# Patient Record
Sex: Male | Born: 2000 | Race: Black or African American | Hispanic: No | Marital: Single | Smoking: Never smoker
Health system: Southern US, Community
[De-identification: ages and names within clinical notes are randomized; demographics above are authoritative.]

## PROBLEM LIST (undated history)

## (undated) DIAGNOSIS — F32A Depression, unspecified: Secondary | ICD-10-CM

## (undated) DIAGNOSIS — F419 Anxiety disorder, unspecified: Secondary | ICD-10-CM

## (undated) DIAGNOSIS — T7840XA Allergy, unspecified, initial encounter: Secondary | ICD-10-CM

## (undated) HISTORY — DX: Anxiety disorder, unspecified: F41.9

## (undated) HISTORY — DX: Allergy, unspecified, initial encounter: T78.40XA

## (undated) HISTORY — DX: Depression, unspecified: F32.A

---
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
## 2020-05-18 DIAGNOSIS — F900 Attention-deficit hyperactivity disorder, predominantly inattentive type: Secondary | ICD-10-CM | POA: Diagnosis not present

## 2020-05-18 DIAGNOSIS — F70 Mild intellectual disabilities: Secondary | ICD-10-CM | POA: Diagnosis not present

## 2020-05-18 DIAGNOSIS — F411 Generalized anxiety disorder: Secondary | ICD-10-CM | POA: Diagnosis not present

## 2020-05-18 DIAGNOSIS — F429 Obsessive-compulsive disorder, unspecified: Secondary | ICD-10-CM | POA: Diagnosis not present

## 2020-12-28 DIAGNOSIS — F900 Attention-deficit hyperactivity disorder, predominantly inattentive type: Secondary | ICD-10-CM | POA: Diagnosis not present

## 2020-12-28 DIAGNOSIS — F411 Generalized anxiety disorder: Secondary | ICD-10-CM | POA: Diagnosis not present

## 2020-12-28 DIAGNOSIS — F70 Mild intellectual disabilities: Secondary | ICD-10-CM | POA: Diagnosis not present

## 2020-12-28 DIAGNOSIS — F429 Obsessive-compulsive disorder, unspecified: Secondary | ICD-10-CM | POA: Diagnosis not present

## 2020-12-30 DIAGNOSIS — F1520 Other stimulant dependence, uncomplicated: Secondary | ICD-10-CM | POA: Diagnosis not present

## 2020-12-30 DIAGNOSIS — Z79899 Other long term (current) drug therapy: Secondary | ICD-10-CM | POA: Diagnosis not present

## 2020-12-30 DIAGNOSIS — Z5181 Encounter for therapeutic drug level monitoring: Secondary | ICD-10-CM | POA: Diagnosis not present

## 2021-01-24 ENCOUNTER — Other Ambulatory Visit: Payer: Self-pay

## 2021-01-24 DIAGNOSIS — R590 Localized enlarged lymph nodes: Secondary | ICD-10-CM | POA: Diagnosis not present

## 2021-01-24 DIAGNOSIS — R519 Headache, unspecified: Secondary | ICD-10-CM | POA: Insufficient documentation

## 2021-01-24 DIAGNOSIS — Z20822 Contact with and (suspected) exposure to covid-19: Secondary | ICD-10-CM | POA: Diagnosis not present

## 2021-01-24 DIAGNOSIS — R509 Fever, unspecified: Secondary | ICD-10-CM | POA: Insufficient documentation

## 2021-01-24 DIAGNOSIS — M542 Cervicalgia: Secondary | ICD-10-CM | POA: Insufficient documentation

## 2021-01-24 DIAGNOSIS — Z03818 Encounter for observation for suspected exposure to other biological agents ruled out: Secondary | ICD-10-CM | POA: Diagnosis not present

## 2021-01-24 DIAGNOSIS — R221 Localized swelling, mass and lump, neck: Secondary | ICD-10-CM | POA: Diagnosis not present

## 2021-01-24 LAB — CBC
HCT: 42.4 % (ref 39.0–52.0)
Hemoglobin: 13.9 g/dL (ref 13.0–17.0)
MCH: 27.3 pg (ref 26.0–34.0)
MCHC: 32.8 g/dL (ref 30.0–36.0)
MCV: 83.3 fL (ref 80.0–100.0)
Platelets: 209 10*3/uL (ref 150–400)
RBC: 5.09 MIL/uL (ref 4.22–5.81)
RDW: 13.2 % (ref 11.5–15.5)
WBC: 8.3 10*3/uL (ref 4.0–10.5)
nRBC: 0 % (ref 0.0–0.2)

## 2021-01-24 LAB — COMPREHENSIVE METABOLIC PANEL
ALT: 21 U/L (ref 0–44)
AST: 21 U/L (ref 15–41)
Albumin: 4.2 g/dL (ref 3.5–5.0)
Alkaline Phosphatase: 64 U/L (ref 38–126)
Anion gap: 9 (ref 5–15)
BUN: 15 mg/dL (ref 6–20)
CO2: 27 mmol/L (ref 22–32)
Calcium: 9.5 mg/dL (ref 8.9–10.3)
Chloride: 102 mmol/L (ref 98–111)
Creatinine, Ser: 1.06 mg/dL (ref 0.61–1.24)
GFR, Estimated: >60 mL/min (ref >60)
Glucose, Bld: 97 mg/dL (ref 70–99)
Potassium: 3.7 mmol/L (ref 3.5–5.1)
Sodium: 138 mmol/L (ref 135–145)
Total Bilirubin: 1 mg/dL (ref 0.3–1.2)
Total Protein: 8.1 g/dL (ref 6.5–8.1)

## 2021-01-24 LAB — RESP PANEL BY RT-PCR (FLU A&B, COVID) ARPGX2
Influenza A by PCR: NEGATIVE
Influenza B by PCR: NEGATIVE
SARS Coronavirus 2 by RT PCR: NEGATIVE

## 2021-01-24 MED ORDER — ACETAMINOPHEN 325 MG PO TABS
650.0000 mg | ORAL_TABLET | Freq: Once | ORAL | Status: AC | PRN
  Administered 2021-01-24: 650 mg via ORAL
  Filled 2021-01-24: qty 2

## 2021-01-24 NOTE — ED Triage Notes (Signed)
Pt reports his s/s started Tuesday, states last time he had antipyretic was at 0600. Pt reports having a headache, chills, and a clicking in his ears

## 2021-01-25 ENCOUNTER — Emergency Department
Admission: EM | Admit: 2021-01-25 | Discharge: 2021-01-25 | Disposition: A | Discharge status: Home / Self Care | Payer: BC Managed Care – PPO | Attending: Emergency Medicine | Admitting: Emergency Medicine

## 2021-01-25 ENCOUNTER — Emergency Department: Payer: BC Managed Care – PPO

## 2021-01-25 DIAGNOSIS — R509 Fever, unspecified: Secondary | ICD-10-CM

## 2021-01-25 DIAGNOSIS — M542 Cervicalgia: Secondary | ICD-10-CM

## 2021-01-25 DIAGNOSIS — R221 Localized swelling, mass and lump, neck: Secondary | ICD-10-CM | POA: Diagnosis not present

## 2021-01-25 DIAGNOSIS — R519 Headache, unspecified: Secondary | ICD-10-CM | POA: Diagnosis not present

## 2021-01-25 LAB — PROTEIN AND GLUCOSE, CSF
Glucose, CSF: 65 mg/dL (ref 40–70)
Total  Protein, CSF: 18 mg/dL (ref 15–45)

## 2021-01-25 LAB — CSF CELL COUNT WITH DIFFERENTIAL
Eosinophils, CSF: 0 %
Eosinophils, CSF: 0 %
Lymphs, CSF: 25 %
Lymphs, CSF: 70 %
Monocyte-Macrophage-Spinal Fluid: 30 %
Monocyte-Macrophage-Spinal Fluid: 75 %
RBC Count, CSF: 0 /mm3 (ref 0–3)
RBC Count, CSF: 3 /mm3 (ref 0–3)
Segmented Neutrophils-CSF: 0 %
Segmented Neutrophils-CSF: 0 %
Tube #: 1
Tube #: 3
WBC, CSF: 3 /mm3 (ref 0–5)
WBC, CSF: 5 /mm3 (ref 0–5)

## 2021-01-25 LAB — GROUP A STREP BY PCR: Group A Strep by PCR: NOT DETECTED

## 2021-01-25 LAB — URINALYSIS, COMPLETE (UACMP) WITH MICROSCOPIC
Bilirubin Urine: NEGATIVE
Glucose, UA: NEGATIVE mg/dL
Hgb urine dipstick: NEGATIVE
Leukocytes,Ua: NEGATIVE
Nitrite: NEGATIVE
Protein, ur: 30 mg/dL — AB
Specific Gravity, Urine: 1.025 (ref 1.005–1.030)
Squamous Epithelial / HPF: NONE SEEN (ref 0–5)
pH: 6 (ref 5.0–8.0)

## 2021-01-25 LAB — PROTIME-INR
INR: 1.2 (ref 0.8–1.2)
Prothrombin Time: 14.8 seconds (ref 11.4–15.2)

## 2021-01-25 LAB — MONONUCLEOSIS SCREEN: Mono Screen: NEGATIVE

## 2021-01-25 LAB — LACTIC ACID, PLASMA: Lactic Acid, Venous: 0.8 mmol/L (ref 0.5–1.9)

## 2021-01-25 LAB — HIV ANTIBODY (ROUTINE TESTING W REFLEX): HIV Screen 4th Generation wRfx: NONREACTIVE

## 2021-01-25 LAB — APTT: aPTT: 33 seconds (ref 24–36)

## 2021-01-25 LAB — PROCALCITONIN: Procalcitonin: 0.11 ng/mL

## 2021-01-25 MED ORDER — IOHEXOL 300 MG/ML  SOLN
75.0000 mL | Freq: Once | INTRAMUSCULAR | Status: AC | PRN
  Administered 2021-01-25: 75 mL via INTRAVENOUS
  Filled 2021-01-25: qty 75

## 2021-01-25 MED ORDER — KETOROLAC TROMETHAMINE 30 MG/ML IJ SOLN
15.0000 mg | Freq: Once | INTRAMUSCULAR | Status: AC
  Administered 2021-01-25: 15 mg via INTRAVENOUS
  Filled 2021-01-25: qty 1

## 2021-01-25 MED ORDER — LACTATED RINGERS IV BOLUS
1000.0000 mL | Freq: Once | INTRAVENOUS | Status: AC
  Administered 2021-01-25: 1000 mL via INTRAVENOUS

## 2021-01-25 NOTE — ED Provider Notes (Signed)
[facility] Provider Note    Event Date/Time   First MD Initiated Contact with Patient 01/25/21 [id number]     (approximate)   History   Fever and Headache   HPI  Ricky Ramsey is a 21 y.o. male without significant past medical history who presents accompanied by his father for assessment of 4 days of fever and headache.  Patient states he has also had some soreness on the left side of his neck and he has some achiness in his lower back as well.  He endorses some photophobia.  He has tried Tylenol ibuprofen but this has not helped that much.  He also states that her left ear has been popping and have some pressure.  No right ear pain.  He denies any nausea, vomiting, diarrhea, burning with urination, cough, chest pain, shortness of breath, rash or extremity pain.  No recent falls or injuries.  He denies EtOH use illicit drug use or tobacco abuse.  No other acute concerns at this time.  Patient denies any IV drug use or history of diabetes or any recent trauma.      Physical Exam  Triage Vital Signs: ED Triage Vitals [01/24/21 1904]  Enc Vitals Group     BP 109/69     Pulse Rate (!) 118     Resp 18     Temp (!) 102.9 F (39.4 C)     Temp Source Oral     SpO2 99 %     Weight 187 lb (84.8 kg)     Height '5\' 11"'  (1.803 m)     Head Circumference      Peak Flow      Pain Score 4     Pain Loc      Pain Edu?      Excl. in [place]?     Most recent vital signs: Vitals:   01/25/21 0236 01/25/21 0610  BP: 116/64 112/61  Pulse: 89 81  Resp: 18 16  Temp: (!) 101 F (38.3 C)   SpO2: 97% 96%    General: Awake, appears uncomfortable. CV:  Good peripheral perfusion.  2+ radial pulses.  No murmurs rubs or gallops. Resp:  Normal effort.  Clear bilaterally. Abd:  No distention.  Soft throughout. Other:  Some mild posterior oropharyngeal erythema.  There is also significant left-sided cervical lymphadenopathy as well as some edema and tenderness over the left neck at  the inferior portion.  TMs are unremarkable bilaterally.  Patient has slightly decreased range of motion with pain to left and right but is able to flex and extend all but very slowly.  He does seem photophobic.  Cranial nerves II through XII are grossly intact.  He has symmetric strength in all extremities.  His lower back is unremarkable.   ED Results / Procedures / Treatments  Labs (all labs ordered are listed, but only abnormal results are displayed) Labs Reviewed  URINALYSIS, COMPLETE (UACMP) WITH MICROSCOPIC - Abnormal; Notable for the following components:      Result Value   Ketones, ur TRACE (*)    Protein, ur 30 (*)    Bacteria, UA RARE (*)    All other components within normal limits  RESP PANEL BY RT-PCR (FLU A&B, COVID) [id number]  GROUP A STREP BY PCR  CSF CULTURE W GRAM STAIN  CULTURE, BLOOD (ROUTINE X 2)  CULTURE, BLOOD (ROUTINE X 2)  CBC  COMPREHENSIVE METABOLIC PANEL  LACTIC ACID, PLASMA  PROTIME-INR  APTT  PROCALCITONIN  MONONUCLEOSIS SCREEN  CSF CELL COUNT WITH DIFFERENTIAL  PROTEIN AND GLUCOSE, CSF  CSF CELL COUNT WITH DIFFERENTIAL  HIV ANTIBODY (ROUTINE TESTING W REFLEX)  DIFFERENTIAL     EKG     RADIOLOGY  Chest x-ray ordered and reviewed by myself shows no focal consolidation, effusion, edema, pneumothorax or other clear acute intrathoracic process.  I have reviewed radiology's interpretation and agree with their findings.  CT head ordered and reviewed by myself shows no evidence of mass-effect, edema, hemorrhage, ventriculomegaly or other acute intracranial process.  As reviewed radiologist interpretation and agree with their findings.  CT neck shows bilateral lymphadenopathy without any evidence of deep space infection abscess or fluid collection.  Reviewed radiologist rotation and agree with their findings.   PROCEDURES:  Critical Care performed: No  .Lumbar Puncture  Date/Time: 01/25/2021 4:26 AM Performed by: Lucrezia Starch,  MD Authorized by: Lucrezia Starch, MD   Consent:    Consent obtained:  Written and verbal   Consent given by:  Patient   Risks, benefits, and alternatives were discussed: yes     Risks discussed:  Bleeding, headache, infection, repeat procedure, pain and nerve damage   Alternatives discussed:  No treatment and observation Universal protocol:    Patient identity confirmed:  Verbally with patient and arm band Pre-procedure details:    Procedure purpose:  Diagnostic   Preparation: Patient was prepped and draped in usual sterile fashion   Anesthesia:    Anesthesia method:  Local infiltration   Local anesthetic:  Lidocaine 1% w/o epi Procedure details:    Lumbar space:  L3-L4 interspace   Patient position:  Sitting   Needle gauge:  18   Ultrasound guidance: no     Number of attempts:  2   Fluid appearance:  Clear   Tubes of fluid:  4 Post-procedure details:    Puncture site:  Adhesive bandage applied   Procedure completion:  Tolerated well, no immediate complications    MEDICATIONS ORDERED IN ED: Medications  acetaminophen (TYLENOL) tablet 650 mg (650 mg Oral Given 01/24/21 1909)  lactated ringers bolus 1,000 mL (0 mLs Intravenous Stopped 01/25/21 0353)  ketorolac (TORADOL) 30 MG/ML injection 15 mg (15 mg Intravenous Given 01/25/21 0157)  iohexol (OMNIPAQUE) 300 MG/ML solution 75 mL (75 mLs Intravenous Contrast Given 01/25/21 0214)     IMPRESSION / MDM / [place] / ED COURSE  I reviewed the triage vital signs and the nursing notes.                              Differential diagnosis includes, but is not limited to, viral URI, neck abscess, meningitis versus other deep space infection head or neck.  Patient denies any respiratory symptoms or cough and has no findings on exam or chest x-ray to suggest a bacterial pneumonia.  No clear risk factors or specific midline neck pain to suggest epidural abscess.  Chest x-ray ordered and reviewed by myself shows no focal  consolidation, effusion, edema, pneumothorax or other clear acute intrathoracic process.  I have reviewed radiology's interpretation and agree with their findings.  CT head ordered and reviewed by myself shows no evidence of mass-effect, edema, hemorrhage, ventriculomegaly or other acute intracranial process.  As reviewed radiologist interpretation and agree with their findings.  CT neck shows bilateral lymphadenopathy without any evidence of deep space infection abscess or fluid collection.  Reviewed radiologist rotation and agree with their findings.  CMP shows  no significant electrolyte or metabolic derangements.  CBC without leukocytosis or acute anemia.  COVID and influenza PCR is negative.  Lactic acid is nonelevated.  Coagulation studies are unremarkable.  UA is not suggestive of urinary source of infection.  Pro-Cal is 0.11.  Rapid strep screen and monoscreen is negative.  As patient initially met SIRS criteria with fever and tachycardia, screening sepsis labs ordered including blood cultures and lactic acid.  Overall patient does not appear septic on exam and lactic acid is not elevated.  To assess for possible viral meningitis LP performed per procedure note above and CSF sent.  CSF shows 3 WBCs which is not consistent with meningitis.  CSF protein and glucose are within normalized.  Given reassuring CSF suspicion for meningitis and I suspect likely a viral process.  Discussed using Tylenol ibuprofen as needed for fever and aches and recommendation for close outpatient follow-up.  Also discussed return immediately for any new or worsening of symptoms.  Discharged in stable condition.      FINAL CLINICAL IMPRESSION(S) / ED DIAGNOSES   Final diagnoses:  Fever, unspecified fever cause  Nonintractable headache, unspecified chronicity pattern, unspecified headache type  Neck pain     Rx / DC Orders   ED Discharge Orders     None        Note:  This document was prepared using  Dragon voice recognition software and may include unintentional dictation errors.   Lucrezia Starch, MD 01/25/21 [phone]

## 2021-01-28 DIAGNOSIS — R509 Fever, unspecified: Secondary | ICD-10-CM | POA: Diagnosis not present

## 2021-01-28 DIAGNOSIS — R519 Headache, unspecified: Secondary | ICD-10-CM | POA: Diagnosis not present

## 2021-01-28 DIAGNOSIS — H53149 Visual discomfort, unspecified: Secondary | ICD-10-CM | POA: Diagnosis not present

## 2021-01-28 DIAGNOSIS — F419 Anxiety disorder, unspecified: Secondary | ICD-10-CM | POA: Diagnosis not present

## 2021-01-28 LAB — CSF CULTURE W GRAM STAIN
Culture: NO GROWTH
Gram Stain: NONE SEEN

## 2021-01-30 LAB — CULTURE, BLOOD (ROUTINE X 2)
Culture: NO GROWTH
Culture: NO GROWTH
Special Requests: ADEQUATE
Special Requests: ADEQUATE

## 2021-03-25 ENCOUNTER — Ambulatory Visit: Payer: BC Managed Care – PPO | Admitting: Nurse Practitioner

## 2021-03-25 ENCOUNTER — Other Ambulatory Visit: Payer: Self-pay

## 2021-03-25 ENCOUNTER — Encounter: Payer: Self-pay | Admitting: Nurse Practitioner

## 2021-03-25 VITALS — BP 110/72 | HR 100 | Temp 98.7°F | Resp 18 | Ht 69.5 in | Wt 179.5 lb

## 2021-03-25 DIAGNOSIS — F419 Anxiety disorder, unspecified: Secondary | ICD-10-CM | POA: Diagnosis not present

## 2021-03-25 DIAGNOSIS — Z0000 Encounter for general adult medical examination without abnormal findings: Secondary | ICD-10-CM | POA: Diagnosis not present

## 2021-03-25 DIAGNOSIS — Z7689 Persons encountering health services in other specified circumstances: Secondary | ICD-10-CM | POA: Diagnosis not present

## 2021-03-25 DIAGNOSIS — F330 Major depressive disorder, recurrent, mild: Secondary | ICD-10-CM | POA: Insufficient documentation

## 2021-03-25 NOTE — Progress Notes (Signed)
BP 110/72   Pulse 100   Temp 98.7 F (37.1 C) (Oral)   Resp 18   Ht 5' 9.5" (1.765 m)   Wt 179 lb 8 oz (81.4 kg)   SpO2 96%   BMI 26.13 kg/m    Subjective:    Patient ID: Ricky Ramsey, male    DOB: 07/05/2000, 21 y.o.   MRN: [id number]  HPI: Ricky Ramsey is a 21 y.o. male, here alone  Chief Complaint  Patient presents with   Establish Care   Patient presents for annual CPE and to establish care  Establish Care: He says his last physical was two years ago.   Depression/anxiety: He says he is currently taking Zoloft from Neurology.  He does have tics when his anxiety increases.   Depression screen [facility] 2/9 03/25/2021  Decreased Interest 0  Down, Depressed, Hopeless 0  PHQ - 2 Score 0  Altered sleeping 0  Tired, decreased energy 0  Change in appetite 0  Feeling bad or failure about yourself  0  Trouble concentrating 0  Moving slowly or fidgety/restless 0  Suicidal thoughts 0  PHQ-9 Score 0  Difficult doing work/chores Not difficult at all   GAD 7 : Generalized Anxiety Score 03/25/2021  Nervous, Anxious, on Edge 1  Control/stop worrying 0  Worry too much - different things 0  Trouble relaxing 0  Restless 0  Easily annoyed or irritable 0  Afraid - awful might happen 0  Total GAD 7 Score 1  Anxiety Difficulty Not difficult at all   IPSS Questionnaire (AUA-7): Over the past month   1)  How often have you had a sensation of not emptying your bladder completely after you finish urinating?  0 - Not at all  2)  How often have you had to urinate again less than two hours after you finished urinating? 0 - Not at all  3)  How often have you found you stopped and started again several times when you urinated?  0 - Not at all  4) How difficult have you found it to postpone urination?  0 - Not at all  5) How often have you had a weak urinary stream?  0 - Not at all  6) How often have you had to push or strain to begin urination?  0 - Not at all  7) How many times did  you most typically get up to urinate from the time you went to bed until the time you got up in the morning?  0 - None  Total score:  0-7 mildly symptomatic   8-19 moderately symptomatic   20-35 severely symptomatic     Diet: spicy working on getting to a more well balanced diet Exercise: walking across campus, discussed increasing physical activity to the recommended 150 min a week Sleep: 7 hours  Hypertension:  BP Readings from Last 3 Encounters:  03/25/21 110/72  01/25/21 112/61  05/28/17 (!) 139/99    Obesity: Wt Readings from Last 3 Encounters:  03/25/21 179 lb 8 oz (81.4 kg)  01/24/21 187 lb (84.8 kg)  05/26/17 146 lb 9.7 oz (66.5 kg) (55 %, Z= 0.12)*   * Growth percentiles are based on CDC (Boys, 2-20 Years) data.   BMI Readings from Last 3 Encounters:  03/25/21 26.13 kg/m  01/24/21 26.08 kg/m     Lipids:  Lab Results  Component Value Date   CHOL 189 (H) 07/13/2018   Lab Results  Component Value Date   HDL 44  07/13/2018   Lab Results  Component Value Date   LDLCALC 131 (H) 07/13/2018   Lab Results  Component Value Date   TRIG 68 07/13/2018   Lab Results  Component Value Date   CHOLHDL 4.3 07/13/2018   No results found for: LDLDIRECT Glucose:  Glucose, Bld  Date Value Ref Range Status  01/24/2021 97 70 - 99 mg/dL Final    Comment:    Glucose reference range applies only to samples taken after fasting for at least 8 hours.  05/26/2017 102 (H) 65 - 99 mg/dL Final    Flowsheet Row Office Visit from 03/25/2021 in [facility]  AUDIT-C Score 0       Single STD testing and prevention (HIV/chl/gon/syphilis): 01/25/2021 Hep C: postponed until next time we do labs  Skin cancer: Discussed monitoring for atypical lesions Colorectal cancer: no conerns Prostate cancer: no concerns No results found for: PSA   Lung cancer:   Low Dose CT Chest recommended if Age 42-80 years, 30 pack-year currently smoking OR have quit w/in 15years.  Patient does not qualify.   AAA:  The USPSTF recommends one-time screening with ultrasonography in men ages 39 to 29 years who have ever smoked ECG:  01/25/2021  Vaccines:  HPV: up to at age 48 , ask insurance if age between 44-45  Shingrix: 72-64 yo and ask insurance if covered when patient above 56 yo Pneumonia:  educated and discussed with patient. Flu:  educated and discussed with patient.  Advanced Care Planning: A voluntary discussion about advance care planning including the explanation and discussion of advance directives.  Discussed health care proxy and Living will.  Patient Active Problem List   Diagnosis Date Noted   Mood disorder (HCC) 03/22/2017   Motor tic disorder 02/17/2017    No past surgical history on file.  No family history on file.  Social History   Socioeconomic History   Marital status: Single    Spouse name: Not on file   Number of children: Not on file   Years of education: Not on file   Highest education level: Not on file  Occupational History   Not on file  Tobacco Use   Smoking status: Never   Smokeless tobacco: Never  Vaping Use   Vaping Use: Never used  Substance and Sexual Activity   Alcohol use: Never   Drug use: Never   Sexual activity: Yes    Birth control/protection: Condom  Other Topics Concern   Not on file  Social History Narrative   [employer] in Psychology   Social Determinants of Health   Financial Resource Strain: Low Risk    Difficulty of Paying Living Expenses: Not hard at all  Food Insecurity: No Food Insecurity   Worried About Programme researcher, broadcasting/film/video in the Last Year: Never true   Barista in the Last Year: Never true  Transportation Needs: No Transportation Needs   Lack of Transportation (Medical): No   Lack of Transportation (Non-Medical): No  Physical Activity: Insufficiently Active   Days of Exercise per Week: 3 days   Minutes of Exercise per Session: 30 min  Stress: No Stress  Concern Present   Feeling of Stress : Not at all  Social Connections: Moderately Integrated   Frequency of Communication with Friends and Family: More than three times a week   Frequency of Social Gatherings with Friends and Family: More than three times a week   Attends Religious Services: More  than 4 times per year   Active Member of Clubs or Organizations: Yes   Attends Engineer, structural: More than 4 times per year   Marital Status: Never married  Intimate Partner Violence: Not At Risk   Fear of Current or Ex-Partner: No   Emotionally Abused: No   Physically Abused: No   Sexually Abused: No     Current Outpatient Medications:    sertraline (ZOLOFT) 100 MG tablet, Take 100 mg by mouth daily., Disp: , Rfl:   No Known Allergies   ROS  Constitutional: Negative for fever or weight change.  Respiratory: Negative for cough and shortness of breath.   Cardiovascular: Negative for chest pain or palpitations.  Gastrointestinal: Negative for abdominal pain, no bowel changes.  Musculoskeletal: Negative for gait problem or joint swelling.  Skin: Negative for rash.  Neurological: Negative for dizziness or headache.  No other specific complaints in a complete review of systems (except as listed in HPI above).    Objective  Vitals:   03/25/21 1334  BP: 110/72  Pulse: 100  Resp: 18  Temp: 98.7 F (37.1 C)  TempSrc: Oral  SpO2: 96%  Weight: 179 lb 8 oz (81.4 kg)  Height: 5' 9.5" (1.765 m)    Body mass index is 26.13 kg/m.  Physical Exam  Constitutional: Patient appears well-developed and well-nourished. No distress.  HENT: Head: Normocephalic and atraumatic. Ears: B TMs ok, no erythema or effusion; Nose: Nose normal. Mouth/Throat: Oropharynx is clear and moist. No oropharyngeal exudate.  Eyes: Conjunctivae and EOM are normal. Pupils are equal, round, and reactive to light. No scleral icterus.  Neck: Normal range of motion. Neck supple. No JVD present. No  thyromegaly present.  Cardiovascular: Normal rate, regular rhythm and normal heart sounds.  No murmur heard. No BLE edema. Pulmonary/Chest: Effort normal and breath sounds normal. No respiratory distress. Abdominal: Soft. Bowel sounds are normal, no distension. There is no tenderness. no masses Musculoskeletal: Normal range of motion, no joint effusions. No gross deformities Neurological: he is alert and oriented to person, place, and time. No cranial nerve deficit. Coordination, balance, strength, speech and gait are normal.  Skin: Skin is warm and dry. No rash noted. No erythema.  Psychiatric: Patient has a normal mood and affect. behavior is normal. Judgment and thought content normal.   Recent Results (from the past 2160 hour(s))  CBC     Status: None   Collection Time: 01/24/21  7:08 PM  Result Value Ref Range   WBC 8.3 4.0 - 10.5 K/uL   RBC 5.09 4.22 - 5.81 MIL/uL   Hemoglobin 13.9 13.0 - 17.0 g/dL   HCT 16.1 09.6 - 04.5 %   MCV 83.3 80.0 - 100.0 fL   MCH 27.3 26.0 - 34.0 pg   MCHC 32.8 30.0 - 36.0 g/dL   RDW 40.9 81.1 - 91.4 %   Platelets 209 150 - 400 K/uL   nRBC 0.0 0.0 - 0.2 %    Comment: Performed at [facility], [address] [place], [place] [address]  Comprehensive metabolic panel     Status: None   Collection Time: 01/24/21  7:08 PM  Result Value Ref Range   Sodium 138 135 - 145 mmol/L   Potassium 3.7 3.5 - 5.1 mmol/L   Chloride 102 98 - 111 mmol/L   CO2 27 22 - 32 mmol/L   Glucose, Bld 97 70 - 99 mg/dL    Comment: Glucose reference range applies only to samples taken after  fasting for at least 8 hours.   BUN 15 6 - 20 mg/dL   Creatinine, Ser 1.61 0.61 - 1.24 mg/dL   Calcium 9.5 8.9 - 09.6 mg/dL   Total Protein 8.1 6.5 - 8.1 g/dL   Albumin 4.2 3.5 - 5.0 g/dL   AST 21 15 - 41 U/L   ALT 21 0 - 44 U/L   Alkaline Phosphatase 64 38 - 126 U/L   Total Bilirubin 1.0 0.3 - 1.2 mg/dL   GFR, Estimated >04 >54 mL/min    Comment: (NOTE) Calculated using the  CKD-EPI Creatinine Equation (2021)    Anion gap 9 5 - 15    Comment: Performed at [facility], [address] [place], [place] [address]  Resp Panel by RT-PCR (Flu A&B, Covid) Nasopharyngeal Swab     Status: None   Collection Time: 01/24/21  7:08 PM   Specimen: Nasopharyngeal Swab; Nasopharyngeal(NP) swabs in vial transport medium  Result Value Ref Range   SARS Coronavirus 2 by RT PCR NEGATIVE NEGATIVE    Comment: (NOTE) SARS-CoV-2 target nucleic acids are NOT DETECTED.  The SARS-CoV-2 RNA is generally detectable in upper respiratory specimens during the acute phase of infection. The lowest concentration of SARS-CoV-2 viral copies this assay can detect is 138 copies/mL. A negative result does not preclude SARS-Cov-2 infection and should not be used as the sole basis for treatment or other patient management decisions. A negative result may occur with  improper specimen collection/handling, submission of specimen other than nasopharyngeal swab, presence of viral mutation(s) within the areas targeted by this assay, and inadequate number of viral copies(<138 copies/mL). A negative result must be combined with clinical observations, patient history, and epidemiological information. The expected result is Negative.  Fact Sheet for Patients:  BloggerCourse.com  Fact Sheet for Healthcare Providers:  SeriousBroker.it  This test is no t yet approved or cleared by the [place] FDA and  has been authorized for detection and/or diagnosis of SARS-CoV-2 by FDA under an Emergency Use Authorization (EUA). This EUA will remain  in effect (meaning this test can be used) for the duration of the COVID-19 declaration under Section 564(b)(1) of the Act, 21 U.S.C.section 360bbb-3(b)(1), unless the authorization is terminated  or revoked sooner.       Influenza A by PCR NEGATIVE NEGATIVE   Influenza B by PCR NEGATIVE NEGATIVE     Comment: (NOTE) The Xpert Xpress SARS-CoV-2/FLU/RSV plus assay is intended as an aid in the diagnosis of influenza from Nasopharyngeal swab specimens and should not be used as a sole basis for treatment. Nasal washings and aspirates are unacceptable for Xpert Xpress SARS-CoV-2/FLU/RSV testing.  Fact Sheet for Patients: BloggerCourse.com  Fact Sheet for Healthcare Providers: SeriousBroker.it  This test is not yet approved or cleared by the [place] FDA and has been authorized for detection and/or diagnosis of SARS-CoV-2 by FDA under an Emergency Use Authorization (EUA). This EUA will remain in effect (meaning this test can be used) for the duration of the COVID-19 declaration under Section 564(b)(1) of the Act, 21 U.S.C. section 360bbb-3(b)(1), unless the authorization is terminated or revoked.  Performed at [facility], [address] [place], [place] [address]   Lactic acid, plasma     Status: None   Collection Time: 01/25/21  1:35 AM  Result Value Ref Range   Lactic Acid, Venous 0.8 0.5 - 1.9 mmol/L    Comment: Performed at [facility], [address], [place], [place] [address]  Protime-INR  Status: None   Collection Time: 01/25/21  1:35 AM  Result Value Ref Range   Prothrombin Time 14.8 11.4 - 15.2 seconds   INR 1.2 0.8 - 1.2    Comment: (NOTE) INR goal varies based on device and disease states. Performed at [facility], [address] [place], [place] [address]   APTT     Status: None   Collection Time: 01/25/21  1:35 AM  Result Value Ref Range   aPTT 33 24 - 36 seconds    Comment: Performed at [facility], [address] [place], [place] [address]  Blood Culture (routine x 2)     Status: None   Collection Time: 01/25/21  1:35 AM   Specimen: BLOOD  Result Value Ref Range   Specimen Description BLOOD RIGHT ANTECUBITAL    Special Requests      BOTTLES DRAWN  AEROBIC AND ANAEROBIC Blood Culture adequate volume   Culture      NO GROWTH 5 DAYS Performed at [facility], [address] [place], [place] [address]    Report Status 01/30/2021 FINAL   Blood Culture (routine x 2)     Status: None   Collection Time: 01/25/21  1:35 AM   Specimen: BLOOD  Result Value Ref Range   Specimen Description BLOOD LEFT ANTECUBITAL    Special Requests      BOTTLES DRAWN AEROBIC AND ANAEROBIC Blood Culture adequate volume   Culture      NO GROWTH 5 DAYS Performed at [facility], [address] [place], [place] [address]    Report Status 01/30/2021 FINAL   Urinalysis, Complete w Microscopic     Status: Abnormal   Collection Time: 01/25/21  1:35 AM  Result Value Ref Range   Color, Urine YELLOW YELLOW   APPearance CLEAR CLEAR   Specific Gravity, Urine 1.025 1.005 - 1.030   pH 6.0 5.0 - 8.0   Glucose, UA NEGATIVE NEGATIVE mg/dL   Hgb urine dipstick NEGATIVE NEGATIVE   Bilirubin Urine NEGATIVE NEGATIVE   Ketones, ur TRACE (A) NEGATIVE mg/dL   Protein, ur 30 (A) NEGATIVE mg/dL   Nitrite NEGATIVE NEGATIVE   Leukocytes,Ua NEGATIVE NEGATIVE   Squamous Epithelial / LPF NONE SEEN 0 - 5   WBC, UA 0-5 0 - 5 WBC/hpf   RBC / HPF 0-5 0 - 5 RBC/hpf   Bacteria, UA RARE (A) NONE SEEN   Mucus PRESENT     Comment: Performed at [facility], [address] [place], [place] [address]  Procalcitonin - Baseline     Status: None   Collection Time: 01/25/21  1:35 AM  Result Value Ref Range   Procalcitonin 0.11 ng/mL    Comment:        Interpretation: PCT (Procalcitonin) <= 0.5 ng/mL: Systemic infection (sepsis) is not likely. Local bacterial infection is possible. (NOTE)       Sepsis PCT Algorithm           Lower Respiratory Tract                                      Infection PCT Algorithm    ----------------------------     ----------------------------         PCT < 0.25 ng/mL                PCT < 0.10 ng/mL  Strongly  encourage             Strongly discourage   discontinuation of antibiotics    initiation of antibiotics    ----------------------------     -----------------------------       PCT 0.25 - 0.50 ng/mL            PCT 0.10 - 0.25 ng/mL               OR       >80% decrease in PCT            Discourage initiation of                                            antibiotics      Encourage discontinuation           of antibiotics    ----------------------------     -----------------------------         PCT >= 0.50 ng/mL              PCT 0.26 - 0.50 ng/mL               AND        <80% decrease in PCT             Encourage initiation of                                             antibiotics       Encourage continuation           of antibiotics    ----------------------------     -----------------------------        PCT >= 0.50 ng/mL                  PCT > 0.50 ng/mL               AND         increase in PCT                  Strongly encourage                                      initiation of antibiotics    Strongly encourage escalation           of antibiotics                                     -----------------------------                                           PCT <= 0.25 ng/mL                                                 OR                                        >  80% decrease in PCT                                      Discontinue / Do not initiate                                             antibiotics  Performed at [facility], [address] [place], [place] [address]   HIV Antibody (routine testing w rflx)     Status: None   Collection Time: 01/25/21  1:35 AM  Result Value Ref Range   HIV Screen 4th Generation wRfx Non Reactive Non Reactive    Comment: Performed at [facility], [address] [place], [place] [address]  Group A Strep by PCR [facility] Only)     Status: None   Collection Time: 01/25/21  3:01 AM   Specimen: Throat; Sterile Swab  Result Value  Ref Range   Group A Strep by PCR NOT DETECTED NOT DETECTED    Comment: Performed at [facility], [address] [place], [place] [address]  Mononucleosis screen     Status: None   Collection Time: 01/25/21  3:01 AM  Result Value Ref Range   Mono Screen NEGATIVE NEGATIVE    Comment: Performed at [facility], [address] [place], [place] [address]  CSF cell count with differential     Status: None   Collection Time: 01/25/21  4:19 AM  Result Value Ref Range   Tube # 1    Color, CSF COLORLESS COLORLESS   Appearance, CSF CLEAR CLEAR   Supernatant CLEAR    RBC Count, CSF 3 0 - 3 /cu mm   WBC, CSF 5 0 - 5 /cu mm   Segmented Neutrophils-CSF 0 %   Lymphs, CSF 70 %   Monocyte-Macrophage-Spinal Fluid 30 %   Eosinophils, CSF 0 %    Comment: Performed at [facility], [address] [place], [place] [address]  Protein and glucose, CSF     Status: None   Collection Time: 01/25/21  4:19 AM  Result Value Ref Range   Glucose, CSF 65 40 - 70 mg/dL   Total  Protein, CSF 18 15 - 45 mg/dL    Comment: Performed at [facility], [address] [place], [place] [address]  CSF culture w Gram Stain     Status: None   Collection Time: 01/25/21  4:19 AM   Specimen: CSF; Cerebrospinal Fluid  Result Value Ref Range   Specimen Description      CSF Performed at [facility], [address] [place], [place] [address]    Special Requests      NONE Performed at [facility], [address] [place], [place] [address]    Gram Stain      NO ORGANISMS SEEN RED BLOOD CELLS PRESENT NO WBC SEEN CYTOSPIN SMEAR    Culture      NO GROWTH 3 DAYS Performed at [facility], [address], [place], [place] [address]    Report Status 01/28/2021 FINAL   CSF cell count with differential     Status: None   Collection Time: 01/25/21  4:19 AM  Result Value Ref Range   Tube # 3    Color, CSF  COLORLESS COLORLESS   Appearance, CSF CLEAR  CLEAR   Supernatant CLEAR    RBC Count, CSF 0 0 - 3 /cu mm   WBC, CSF 3 0 - 5 /cu mm   Segmented Neutrophils-CSF 0 %   Lymphs, CSF 25 %   Monocyte-Macrophage-Spinal Fluid 75 %   Eosinophils, CSF 0 %    Comment: Performed at [facility], [address] [place], [place] [address]     Fall Risk: Fall Risk  03/25/2021  Falls in the past year? 0  Number falls in past yr: 0  Injury with Fall? 0  Follow up Falls evaluation completed     Functional Status Survey: Is the patient deaf or have difficulty hearing?: No Does the patient have difficulty seeing, even when wearing glasses/contacts?: No Does the patient have difficulty concentrating, remembering, or making decisions?: No Does the patient have difficulty walking or climbing stairs?: No Does the patient have difficulty dressing or bathing?: No Does the patient have difficulty doing errands alone such as visiting a doctor's office or shopping?: No    Assessment & Plan  1. Annual physical exam -increase physical activity to at least 150 min a week -work on a more well balanced diet, try and avoid foods high in saturated fat  2. Mild episode of recurrent major depressive disorder (HCC)  -continue current treatment plan with Neurology 3. Anxiety -continue current treatment plan with Neurology  4. Encounter to establish care  -labs recently done by neurology, will get labs at next physical   -Prostate cancer screening and PSA options (with potential risks and benefits of testing vs not testing) were discussed along with recent recs/guidelines. -USPSTF grade A and B recommendations reviewed with patient; age-appropriate recommendations, preventive care, screening tests, etc discussed and encouraged; healthy living encouraged; see AVS for patient education given to patient -Discussed importance of 150 minutes of physical activity weekly, eat two servings of fish weekly, eat one serving of tree nuts ( cashews,  pistachios, pecans, almonds.Marland Kitchen) every other day, eat 6 servings of fruit/vegetables daily and drink plenty of water and avoid sweet beverages.     Follow up plan: Return in about 1 year (around 03/26/2022) for cpe.

## 2021-09-01 DIAGNOSIS — F411 Generalized anxiety disorder: Secondary | ICD-10-CM | POA: Diagnosis not present

## 2021-09-01 DIAGNOSIS — F429 Obsessive-compulsive disorder, unspecified: Secondary | ICD-10-CM | POA: Diagnosis not present

## 2021-09-01 DIAGNOSIS — F70 Mild intellectual disabilities: Secondary | ICD-10-CM | POA: Diagnosis not present

## 2021-09-01 DIAGNOSIS — F900 Attention-deficit hyperactivity disorder, predominantly inattentive type: Secondary | ICD-10-CM | POA: Diagnosis not present

## 2022-03-02 DIAGNOSIS — Z0389 Encounter for observation for other suspected diseases and conditions ruled out: Secondary | ICD-10-CM | POA: Diagnosis not present

## 2022-03-02 DIAGNOSIS — F1520 Other stimulant dependence, uncomplicated: Secondary | ICD-10-CM | POA: Diagnosis not present

## 2022-03-02 DIAGNOSIS — Z79899 Other long term (current) drug therapy: Secondary | ICD-10-CM | POA: Diagnosis not present

## 2022-03-02 DIAGNOSIS — Z5181 Encounter for therapeutic drug level monitoring: Secondary | ICD-10-CM | POA: Diagnosis not present

## 2022-03-03 DIAGNOSIS — F70 Mild intellectual disabilities: Secondary | ICD-10-CM | POA: Diagnosis not present

## 2022-03-03 DIAGNOSIS — F900 Attention-deficit hyperactivity disorder, predominantly inattentive type: Secondary | ICD-10-CM | POA: Diagnosis not present

## 2022-03-03 DIAGNOSIS — F429 Obsessive-compulsive disorder, unspecified: Secondary | ICD-10-CM | POA: Diagnosis not present

## 2022-03-03 DIAGNOSIS — F411 Generalized anxiety disorder: Secondary | ICD-10-CM | POA: Diagnosis not present

## 2022-11-27 ENCOUNTER — Encounter: Payer: Self-pay | Admitting: Physician Assistant

## 2022-11-27 ENCOUNTER — Ambulatory Visit (INDEPENDENT_AMBULATORY_CARE_PROVIDER_SITE_OTHER): Payer: BC Managed Care – PPO | Admitting: Physician Assistant

## 2022-11-27 VITALS — HR 91 | Temp 98.9°F | Ht 71.0 in | Wt 186.0 lb

## 2022-11-27 DIAGNOSIS — Z113 Encounter for screening for infections with a predominantly sexual mode of transmission: Secondary | ICD-10-CM
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
## 2022-11-30 ENCOUNTER — Ambulatory Visit: Payer: BC Managed Care – PPO | Admitting: Physician Assistant

## 2022-12-01 ENCOUNTER — Other Ambulatory Visit: Payer: Self-pay

## 2022-12-01 ENCOUNTER — Encounter: Payer: Self-pay | Admitting: Medical

## 2022-12-01 ENCOUNTER — Ambulatory Visit (INDEPENDENT_AMBULATORY_CARE_PROVIDER_SITE_OTHER): Payer: BC Managed Care – PPO | Admitting: Medical

## 2022-12-01 VITALS — HR 82 | Temp 97.6°F

## 2022-12-01 DIAGNOSIS — Z113 Encounter for screening for infections with a predominantly sexual mode of transmission: Secondary | ICD-10-CM | POA: Diagnosis not present

## 2022-12-01 LAB — CHLAMYDIA/GONOCOCCUS/TRICHOMONAS, NAA
Chlamydia by NAA: NEGATIVE
Gonococcus by NAA: NEGATIVE
Trich vag by NAA: NEGATIVE

## 2022-12-01 NOTE — Patient Instructions (Signed)
-  You will receive a MyChart message notifying you of your lab results when they are available.  -You may return for repeat STI testing in 3-4 weeks if desired. -Use condoms consistently every time you have sex.

## 2022-12-01 NOTE — Progress Notes (Signed)
[facility] [address] [place], [place] [address] Phone: [phone] Fax: [phone]   Office Visit Note  Patient Name: Ricky Ramsey  Date of ONGEX:[id number]  Med Rec number [id number]  Date of Service: 12/01/2022  Allergies: Patient has no known allergies.  Chief Complaint  Patient presents with   Follow-up     HPI 22 y.o. college student presents with for blood draw.  Was unable to have blood drawn for HIV/Syphilis screening last week. Returning for this today.    Current Medication:  Outpatient Encounter Medications as of 12/01/2022  Medication Sig   sertraline (ZOLOFT) 100 MG tablet Take 150 mg by mouth daily.   No facility-administered encounter medications on file as of 12/01/2022.      Medical History: Past Medical History:  Diagnosis Date   Allergy    Anxiety    Depression      Vital Signs: Pulse 82   Temp 97.6 F (36.4 C) (Tympanic)   SpO2 98%    Review of Systems  Constitutional: Negative.     Physical Exam Vitals reviewed.  Constitutional:      General: He is not in acute distress.    Appearance: He is not ill-appearing.  Neurological:     Mental Status: He is alert.     Recent Results (from the past 2160 hour(s))  Chlamydia/Gonococcus/Trichomonas, NAA     Status: None   Collection Time: 11/27/22  1:21 PM   Specimen: Urine   Urine  Result Value Ref Range   Chlamydia by NAA Negative Negative   Gonococcus by NAA Negative Negative   Trich vag by NAA Negative Negative     Assessment/Plan: 1. Screening examination for sexually transmitted disease Reviewed result of urine test done last week - negative for Chlamydia, Gonorrhea and Trichomonas. Patient understands some sexually transmitted infections are not detectable on testing for several weeks after exposure.  Discussed returning for repeat testing 4 to 6 weeks after most recent sexual encounter.  Encouraged consistent use of condoms every time he has sex.  -  RPR Qual - HIV Antibody (routine testing w rflx)     General Counseling: Ricky Ramsey verbalizes understanding of the findings of todays visit and agrees with plan of treatment. he has been encouraged to call the office with any questions or concerns that should arise related to todays visit.    Time spent:15 Minutes    Jonathon Resides PA-C [facility] 12/01/2022 11:42 AM

## 2022-12-02 LAB — HIV ANTIBODY (ROUTINE TESTING W REFLEX): HIV Screen 4th Generation wRfx: NONREACTIVE

## 2022-12-02 LAB — RPR QUALITATIVE: RPR Ser Ql: NONREACTIVE

## 2022-12-16 ENCOUNTER — Ambulatory Visit: Payer: BC Managed Care – PPO | Admitting: Adult Health

## 2023-02-12 ENCOUNTER — Encounter: Payer: Self-pay | Admitting: Physician Assistant

## 2023-02-12 ENCOUNTER — Ambulatory Visit (INDEPENDENT_AMBULATORY_CARE_PROVIDER_SITE_OTHER): Payer: BC Managed Care – PPO | Admitting: Physician Assistant

## 2023-02-12 VITALS — BP 108/70 | HR 91 | Temp 98.4°F | Ht 71.0 in | Wt 194.0 lb

## 2023-02-12 DIAGNOSIS — R002 Palpitations: Secondary | ICD-10-CM | POA: Diagnosis not present

## 2023-02-12 NOTE — Progress Notes (Signed)
[facility] [address] [place], [place] [address] Phone: [phone] Fax: [phone]   Office Visit Note  Patient Name: Ricky Ramsey  Date of ZHYQM:[id number]  Med Rec number [id number]  Date of Service: 02/12/2023  Patient has no known allergies.  Chief Complaint  Patient presents with   Palpitations     Palpitations    The patient reports experiencing intermittent palpitations and a sensation of discomfort in the chest over the past two days, initially noted after consuming a greasy meal. He describes the sensation as more frequent in the last two days but denies swelling in the legs or other cardiac-related symptoms. He has no personal history of cardiac disease but has a family history of heart attacks on his maternal grandfather's side. He acknowledges recent increased stress and a poorer diet over the past week, which he believes may be contributing factors. While he is currently experiencing symptoms, he describes the sensation as more of a discomfort rather than severe pain.   Current Medication:  Outpatient Encounter Medications as of 02/12/2023  Medication Sig   sertraline (ZOLOFT) 100 MG tablet Take 150 mg by mouth daily.   No facility-administered encounter medications on file as of 02/12/2023.      Medical History: Past Medical History:  Diagnosis Date   Allergy    Anxiety    Depression      Vital Signs: BP 108/70   Pulse 91   Temp 98.4 F (36.9 C) (Tympanic)   Ht 5\' 11"  (1.803 m)   Wt 194 lb (88 kg)   SpO2 98%   BMI 27.06 kg/m    Review of Systems  Cardiovascular:  Positive for palpitations.    Physical Exam Constitutional:      Appearance: Normal appearance.  Cardiovascular:     Rate and Rhythm: Normal rate and regular rhythm.  Pulmonary:     Effort: Pulmonary effort is normal.     Breath sounds: Normal breath sounds.  Neurological:     Mental Status: He is alert.       Assessment/Plan: Had a thorough discussion  with the patient regarding potential causes of palpitations, including stress/anxiety, GERD, and cardiac etiologies. Today's exam was reassuring, and a 3-lead EKG performed via Eko stethoscope showed no abnormalities, with normal sinus rhythm, which provided reassurance as the patient was actively experiencing symptoms. We discussed the impact of diet, particularly how fatty foods can sometimes trigger reflux-related discomfort. Stress and anxiety were identified as the most likely contributors to his symptoms. Given his family history of cardiac disease, we reviewed the option of further workup, including labs and potential referral if symptoms persist or worsen. However, at this time, both the patient and I agree on a plan to monitor symptoms. He will also track his dietary choices to assess for triggers. The patient was provided with precautions regarding when to seek further care.  General Counseling: Christoffer verbalizes understanding of the findings of todays visit and agrees with plan of treatment. I have discussed any further diagnostic evaluation that may be needed or ordered today. We also reviewed his medications today. he has been encouraged to call the office with any questions or concerns that should arise related to todays visit.   No orders of the defined types were placed in this encounter.   No orders of the defined types were placed in this encounter.   Time spent:19 Minutes Time spent includes review of chart, medications, test results, and follow up plan with the patient.  Leroy Kennedy, PA-C [facility]

## 2023-02-15 ENCOUNTER — Encounter: Payer: BC Managed Care – PPO | Admitting: Medical

## 2023-03-12 ENCOUNTER — Encounter: Payer: Self-pay | Admitting: Physician Assistant

## 2023-03-12 ENCOUNTER — Ambulatory Visit (INDEPENDENT_AMBULATORY_CARE_PROVIDER_SITE_OTHER): Payer: BC Managed Care – PPO | Admitting: Physician Assistant

## 2023-03-12 ENCOUNTER — Encounter: Payer: BC Managed Care – PPO | Admitting: Physician Assistant

## 2023-03-12 VITALS — BP 102/64 | HR 96 | Temp 98.4°F

## 2023-03-12 DIAGNOSIS — R1909 Other intra-abdominal and pelvic swelling, mass and lump: Secondary | ICD-10-CM | POA: Diagnosis not present

## 2023-03-12 NOTE — Progress Notes (Signed)
 [facility] [address] [place], [place] [address] Phone: [phone] Fax: [phone]   Office Visit Note  Patient Name: Ricky Ramsey  Date of WNUUV:[id number]  Med Rec number [id number]  Date of Service: 03/12/2023  Patient has no known allergies.  Chief Complaint  Patient presents with   Acute Visit     HPI Patient reports noticing a lump on the inside of his thigh, initially attributing it to an ingrown hair. He sometimes shaves in that area and attempted to squeeze the lump, which was mildly sensitive but did not express any drainage. Unsure duration of the lump but has not been particularly noticeable. He denies any surrounding redness, fever, or other systemic symptoms. His primary concern is uncertainty about what the lump could be.   Current Medication:  Outpatient Encounter Medications as of 03/12/2023  Medication Sig   sertraline (ZOLOFT) 100 MG tablet Take 150 mg by mouth daily.   No facility-administered encounter medications on file as of 03/12/2023.      Medical History: Past Medical History:  Diagnosis Date   Allergy    Anxiety    Depression      Vital Signs: BP 102/64   Pulse 96   Temp 98.4 F (36.9 C) (Tympanic)   SpO2 97%    Review of Systems  Physical Exam Constitutional:      Appearance: Normal appearance.  HENT:     Head: Normocephalic and atraumatic.  Genitourinary:    Comments: On exam, there is a small, firm, non-tender lump in the right groin crease. The area is not erythematous, warm, or fluctuant. No signs of irritation or surrounding induration noted. The mass is well-defined.  Neurological:     Mental Status: He is alert.       Assessment/Plan: Patient presents with a lump on the inner thigh of unknown duration. On exam, there is a small, firm, non-tender mass in the right groin crease without erythema, warmth, fluctuance, or surrounding induration. The lesion appears well-defined and superficial, with no  associated skin changes. Findings are most consistent with a superficial cyst. The patient will monitor for any changes, including growth or the development of symptoms. If the lesion becomes symptomatic or enlarges, imaging will be considered for further evaluation. The patient is agreeable to this plan.  General Counseling: Jayson verbalizes understanding of the findings of todays visit and agrees with plan of treatment. I have discussed any further diagnostic evaluation that may be needed or ordered today. We also reviewed his medications today. he has been encouraged to call the office with any questions or concerns that should arise related to todays visit.   No orders of the defined types were placed in this encounter.   No orders of the defined types were placed in this encounter.   Time spent:20 Minutes Time spent includes review of chart, medications, test results, and follow up plan with the patient.    Leroy Kennedy, PA-C [facility]

## 2023-05-04 IMAGING — CT CT HEAD W/O CM
4 series · 17 of 47 positions shown, 19 images · non-contrast
Comparison: MR head, dated April 06, 2017

CLINICAL DATA: Headache, chills and clicking in his ears.



[Series 2: head bone · axial · 0.43mm/px · z∈[-114,-58]mm · 4 of 83 slices shown]
[im 9/83  bone]
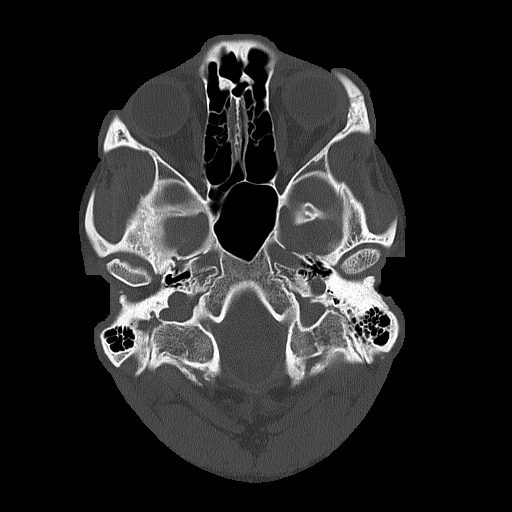
[im 17/83  bone]
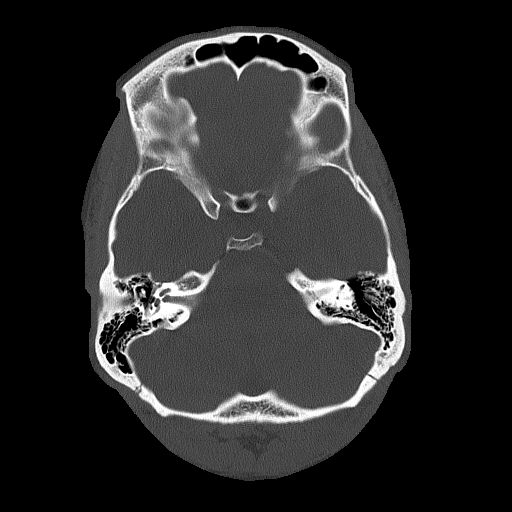
[im 25/83  bone]
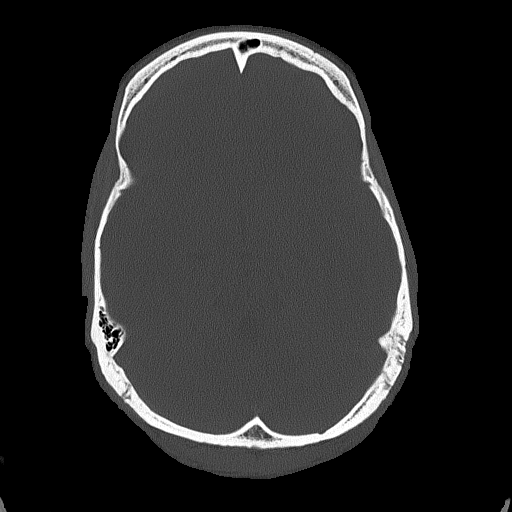
[im 37/83  bone]
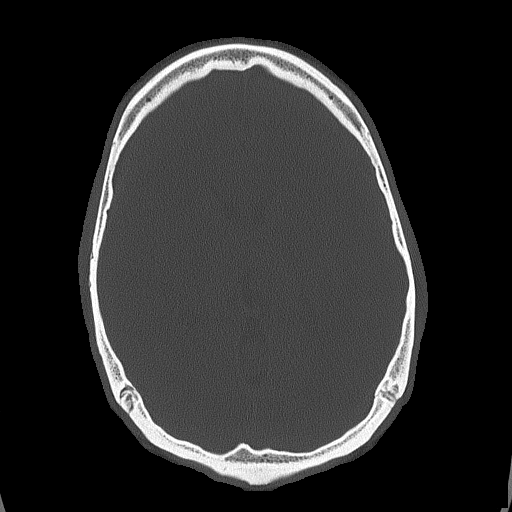

[Series 3: head wo · axial · 0.43mm/px · z∈[-110,+10]mm · 7 of 34 slices shown, 9 images]
[im 5/34  brain]
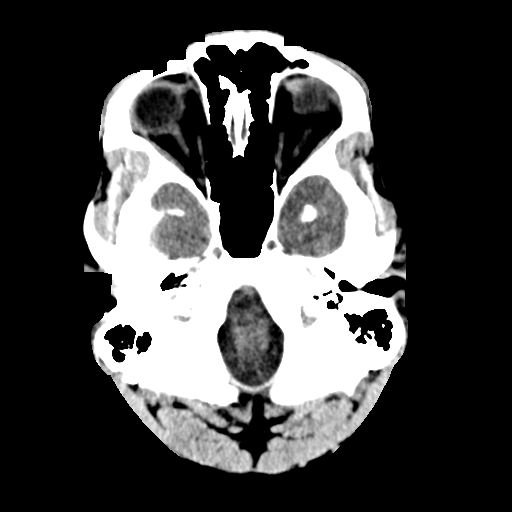
[im 5/34  bone]
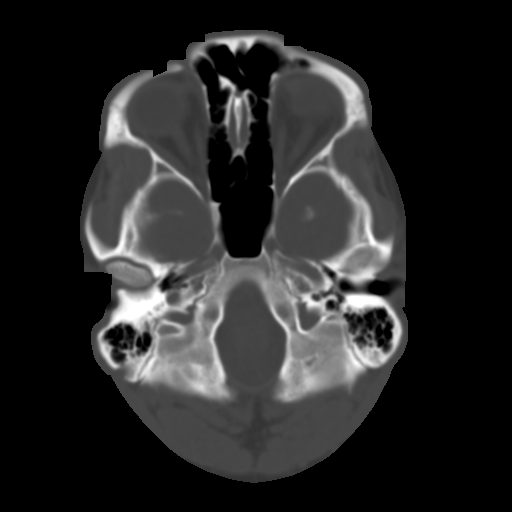
[im 9/34  brain]
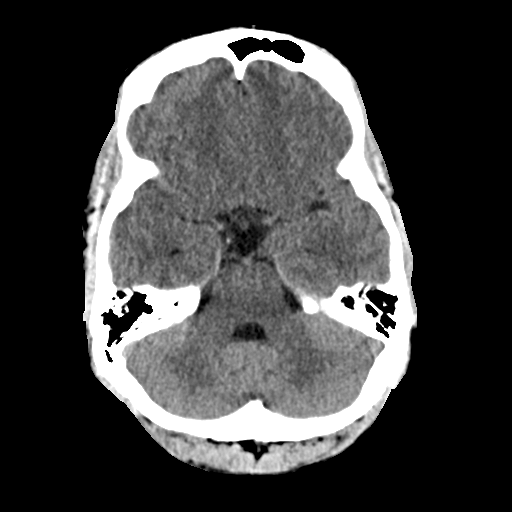
[im 13/34  brain]
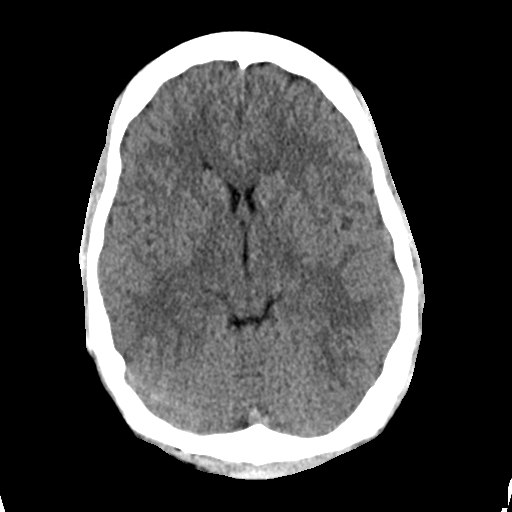
[im 17/34  brain]
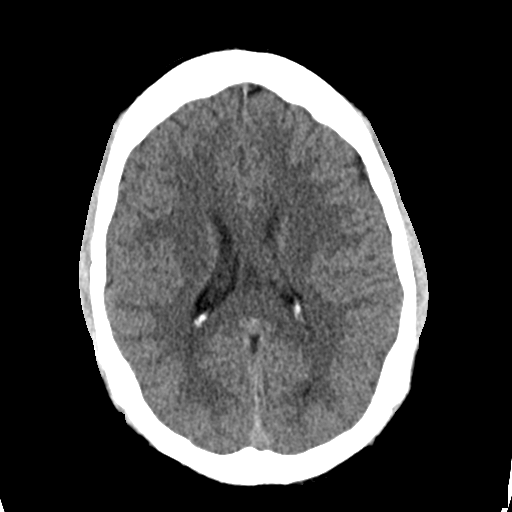
[im 21/34  brain]
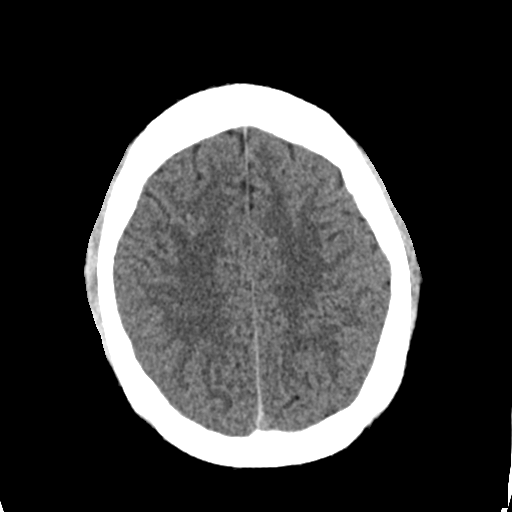
[im 21/34  bone]
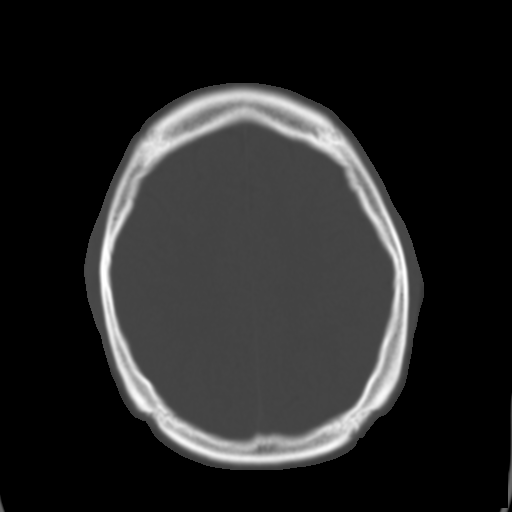
[im 25/34  brain]
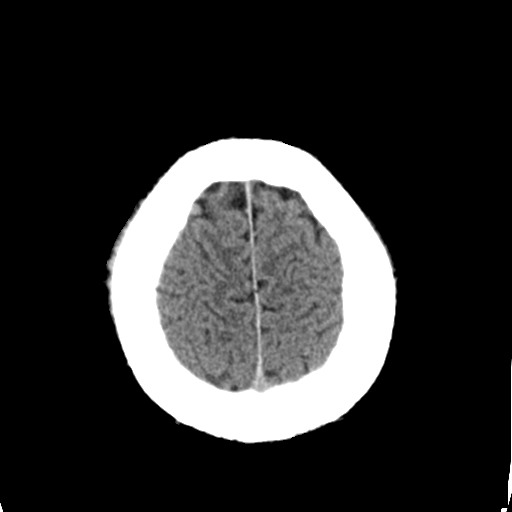
[im 29/34  brain]
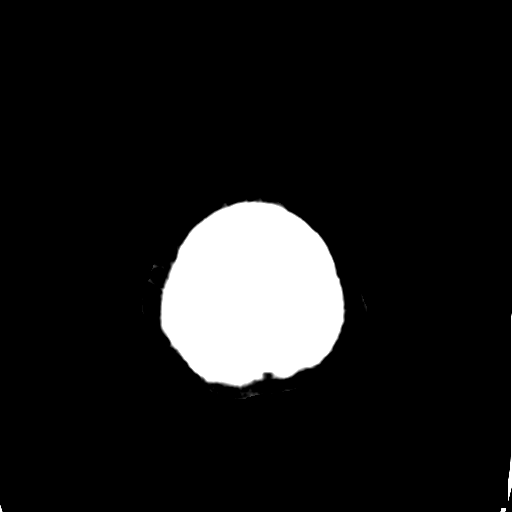

[Series 4: coronal soft tissue · coronal · 0.34mm/px · 3 of 70 slices shown]
[im 24/70  brain]
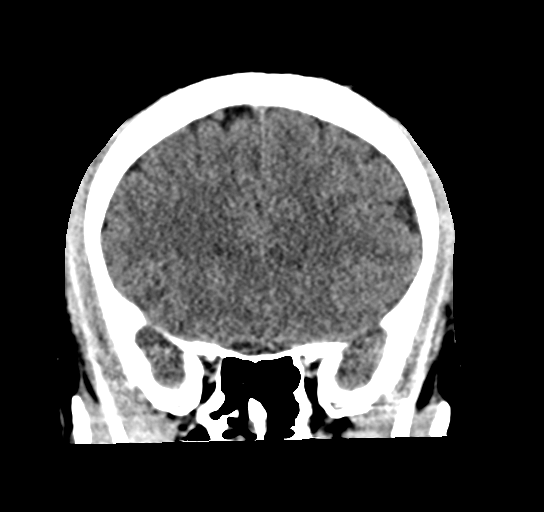
[im 31/70  brain]
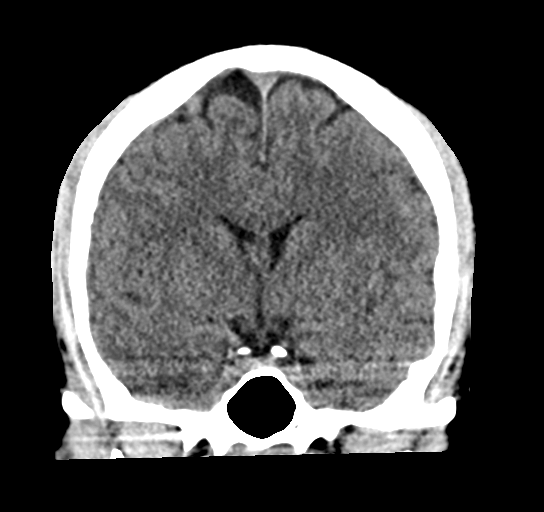
[im 39/70  brain]
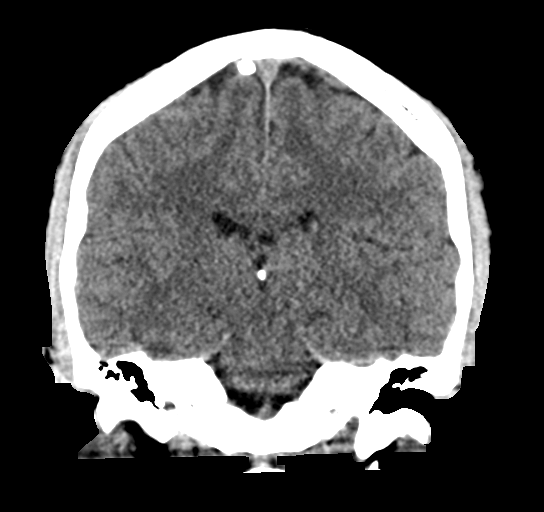

[Series 5: sagittal soft tissue · sagittal · 0.34mm/px · 3 of 63 slices shown]
[im 21/63  brain]
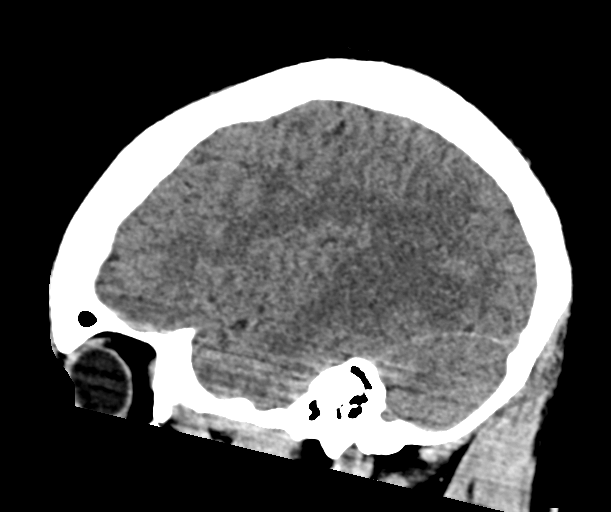
[im 32/63  brain]
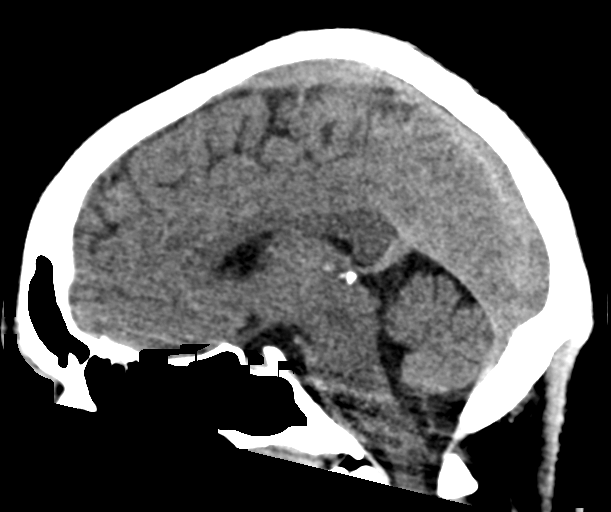
[im 42/63  brain]
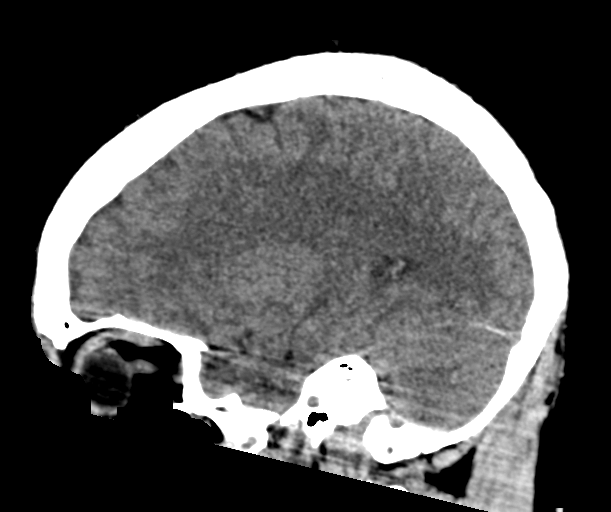

[17 of 47 positions shown; findings below may reference images not displayed]

FINDINGS: Brain: No evidence of acute infarction, hemorrhage, hydrocephalus,
extra-axial collection or mass lesion/mass effect.

Vascular: No hyperdense vessel or unexpected calcification.

Skull: Normal. Negative for fracture or focal lesion.

Sinuses/Orbits: No acute finding.

Other: None.
IMPRESSION: No acute intracranial pathology.

## 2023-07-14 ENCOUNTER — Encounter: Payer: Self-pay | Admitting: Nurse Practitioner

## 2023-07-14 ENCOUNTER — Ambulatory Visit: Admitting: Nurse Practitioner

## 2023-07-14 VITALS — BP 124/72 | HR 83 | Temp 98.0°F | Resp 16 | Ht 71.0 in | Wt 189.6 lb

## 2023-07-14 DIAGNOSIS — J392 Other diseases of pharynx: Secondary | ICD-10-CM | POA: Diagnosis not present

## 2023-07-14 DIAGNOSIS — Z23 Encounter for immunization: Secondary | ICD-10-CM | POA: Diagnosis not present

## 2023-07-14 DIAGNOSIS — G47429 Narcolepsy in conditions classified elsewhere without cataplexy: Secondary | ICD-10-CM

## 2023-07-14 DIAGNOSIS — D529 Folate deficiency anemia, unspecified: Secondary | ICD-10-CM | POA: Diagnosis not present

## 2023-07-14 DIAGNOSIS — G4710 Hypersomnia, unspecified: Secondary | ICD-10-CM | POA: Diagnosis not present

## 2023-07-14 DIAGNOSIS — G47419 Narcolepsy without cataplexy: Secondary | ICD-10-CM

## 2023-07-14 DIAGNOSIS — R0683 Snoring: Secondary | ICD-10-CM

## 2023-07-14 NOTE — Progress Notes (Signed)
 BP 124/72   Pulse 83   Temp 98 F (36.7 C)   Resp 16   Ht 5' 11 (1.803 m)   Wt 189 lb 9.6 oz (86 kg)   SpO2 93%   BMI 26.44 kg/m    Subjective:    Patient ID: Ricky Ramsey, male    DOB: September 28, 2000, 23 y.o.   MRN: [id number]  HPI: Ricky Ramsey is a 23 y.o. male  Chief Complaint  Patient presents with   Allergic Reaction    Throat irritation around scented fragrance    narcolepsy    Possibly dozing off only while driving on highway    Discussed the use of AI scribe software for clinical note transcription with the patient, who gave verbal consent to proceed.  History of Present Illness Ricky Ramsey is a 23 year old male who presents with episodes of drowsiness while driving and throat sensitivity to smells.  He experiences episodes of drowsiness specifically while driving on the highway at speeds of 60-65 mph. His car starts to vibrate at these speeds, which seems to trigger the drowsiness. Initially attributed to being tired, he noted the pattern persisted under the same conditions. Increasing speed alleviates the vibration and the associated drowsiness. No symptoms occur at other times. He has a history of seeing a neurologist but is no longer under his care as he was doing well off medication. He sleeps fine at night but struggles to wake up and often feels tired in the morning. He has been told once that he snores but is unsure if this is a regular occurrence. No shortness of breath or dizziness, but minimal chest pain when exposed to certain smells.  He reports a sensitivity in his throat to various smells, such as perfumes and air fresheners, causing irritation and a scratchy sensation. He can sometimes 'taste it in the upper throat.' This sensitivity began after being exposed to concrete fumes for an extended period. Symptoms typically resolve within 5-10 minutes after removing himself from the smell. No significant swelling or difficulty swallowing, but minimal chest  pain associated with certain smells.         07/14/2023    8:35 AM 03/25/2021    1:37 PM  Depression screen PHQ 2/9  Decreased Interest 0 0  Down, Depressed, Hopeless 0 0  PHQ - 2 Score 0 0  Altered sleeping 0 0  Tired, decreased energy 0 0  Change in appetite 0 0  Feeling bad or failure about yourself  0 0  Trouble concentrating 0 0  Moving slowly or fidgety/restless 0 0  Suicidal thoughts 0 0  PHQ-9 Score 0 0  Difficult doing work/chores Not difficult at all Not difficult at all    Relevant past medical, surgical, family and social history reviewed and updated as indicated. Interim medical history since our last visit reviewed. Allergies and medications reviewed and updated.  Review of Systems  Ten systems reviewed and is negative except as mentioned in HPI      Objective:     BP 124/72   Pulse 83   Temp 98 F (36.7 C)   Resp 16   Ht 5' 11 (1.803 m)   Wt 189 lb 9.6 oz (86 kg)   SpO2 93%   BMI 26.44 kg/m    Wt Readings from Last 3 Encounters:  07/14/23 189 lb 9.6 oz (86 kg)  02/12/23 194 lb (88 kg)  11/27/22 186 lb (84.4 kg)    Physical Exam Physical Exam GENERAL:  Alert, cooperative, well developed, no acute distress. HEENT: Normocephalic, normal oropharynx, moist mucous membranes, throat normal without swelling. CHEST: Clear to auscultation bilaterally, no wheezes, rhonchi, or crackles. CARDIOVASCULAR: Normal heart rate and rhythm, S1 and S2 normal without murmurs. ABDOMEN: Soft, non-tender, non-distended, without organomegaly, normal bowel sounds. EXTREMITIES: No cyanosis or edema. NEUROLOGICAL: Cranial nerves grossly intact, moves all extremities without gross motor or sensory deficit.   Results for orders placed or performed in visit on 12/01/22  RPR Qual   Collection Time: 12/01/22 11:53 AM  Result Value Ref Range   RPR Ser Ql Non Reactive Non Reactive  HIV Antibody (routine testing w rflx)   Collection Time: 12/01/22 11:53 AM  Result Value Ref  Range   HIV Screen 4th Generation wRfx Non Reactive Non Reactive          Assessment & Plan:   Problem List Items Addressed This Visit   None Visit Diagnoses       Immunization due    -  Primary   Relevant Orders   Tdap vaccine greater than or equal to 7yo IM (Completed)     Narcolepsy due to underlying condition without cataplexy       Relevant Orders   CBC with Differential/Platelet   Comprehensive metabolic panel with GFR   Thyroid Panel With TSH   B12 and Folate Panel   Iron, TIBC and Ferritin Panel   Hemoglobin A1c   Ambulatory referral to Pulmonology     Throat irritation         Snoring       Relevant Orders   Ambulatory referral to Pulmonology        Assessment and Plan Assessment & Plan Hypersensitivity of throat to airborne irritants Reports sensitivity to airborne irritants such as perfumes and air fresheners, leading to throat irritation and a scratchy sensation. Symptoms began after exposure to concrete fumes and have persisted for months. Symptoms resolve within 5-10 minutes after removing the irritant. No associated swelling or difficulty swallowing. Differential diagnosis includes an allergic reaction to airborne irritants. - Advise avoidance of scented products where possible. - Recommend trial of over-the-counter allergy medication. - Consider referral for allergy testing if symptoms persist.  Excessive sleepiness and difficulty waking, possible sleep disorder Reports excessive sleepiness and difficulty waking, particularly when driving at certain speeds on the highway. Nocturnal sleep is reportedly fine, but often feels tired upon waking. Suspected sleep disorder, possibly sleep apnea, given the symptoms. No current neurologist follow-up or medication use for this issue. - Order sleep study to evaluate for possible sleep apnea. - Order blood work to rule out other underlying causes of excessive sleepiness. - Advise avoiding driving at speeds that cause  car vibration until further evaluation is complete.        Follow up plan: Return if symptoms worsen or fail to improve.

## 2023-07-15 ENCOUNTER — Ambulatory Visit: Payer: Self-pay | Admitting: Nurse Practitioner

## 2023-07-15 LAB — COMPREHENSIVE METABOLIC PANEL WITH GFR
AG Ratio: 2.2 (calc) (ref 1.0–2.5)
ALT: 12 U/L (ref 9–46)
AST: 14 U/L (ref 10–40)
Albumin: 5 g/dL (ref 3.6–5.1)
Alkaline phosphatase (APISO): 66 U/L (ref 36–130)
BUN: 13 mg/dL (ref 7–25)
CO2: 28 mmol/L (ref 20–32)
Calcium: 10 mg/dL (ref 8.6–10.3)
Chloride: 104 mmol/L (ref 98–110)
Creat: 0.96 mg/dL (ref 0.60–1.24)
Globulin: 2.3 g/dL (ref 1.9–3.7)
Glucose, Bld: 95 mg/dL (ref 65–99)
Potassium: 4.2 mmol/L (ref 3.5–5.3)
Sodium: 141 mmol/L (ref 135–146)
Total Bilirubin: 0.8 mg/dL (ref 0.2–1.2)
Total Protein: 7.3 g/dL (ref 6.1–8.1)
eGFR: 114 mL/min/{1.73_m2} (ref >=60)

## 2023-07-15 LAB — THYROID PANEL WITH TSH
Free Thyroxine Index: 2.3 (ref 1.4–3.8)
T4, Total: 7.7 ug/dL (ref 4.9–10.5)
TSH: 1.15 m[IU]/L (ref 0.40–4.50)
TSH: 30 m[IU]/L (ref 22–4.50)

## 2023-07-15 LAB — CBC WITH DIFFERENTIAL/PLATELET
Absolute Lymphocytes: 1723 {cells}/uL (ref 850–3900)
Absolute Monocytes: 620 {cells}/uL (ref 200–950)
Basophils Absolute: 33 {cells}/uL (ref 0–200)
Basophils Relative: 0.5 %
Eosinophils Absolute: 152 {cells}/uL (ref 15–500)
Eosinophils Relative: 2.3 %
HCT: 47.8 % (ref 38.5–50.0)
Hemoglobin: 15.2 g/dL (ref 13.2–17.1)
MCH: 27.5 pg (ref 27.0–33.0)
MCHC: 31.8 g/dL — ABNORMAL LOW (ref 32.0–36.0)
MCV: 86.6 fL (ref 80.0–100.0)
MPV: 9.9 fL (ref 7.5–12.5)
Monocytes Relative: 9.4 %
Neutro Abs: 4072 {cells}/uL (ref 1500–7800)
Neutrophils Relative %: 61.7 %
Platelets: 221 10*3/uL (ref 140–400)
RBC: 5.52 10*6/uL (ref 4.20–5.80)
RDW: 13.1 % (ref 11.0–15.0)
Total Lymphocyte: 26.1 %
WBC: 6.6 10*3/uL (ref 3.8–10.8)

## 2023-07-15 LAB — IRON,TIBC AND FERRITIN PANEL
%SAT: 22 % (ref 20–48)
Ferritin: 27 ng/mL — ABNORMAL LOW (ref 38–380)
Iron: 86 ng/mL — AB (ref 50–380)
TIBC: 397 ug/dL (ref 250–425)

## 2023-07-15 LAB — HEMOGLOBIN A1C
Hgb A1c MFr Bld: 5.5 % (ref <5.7)
Mean Plasma Glucose: 111 mg/dL
eAG (mmol/L): 6.2 mmol/L

## 2023-07-15 LAB — B12 AND FOLATE PANEL
Folate: 5.2 ng/mL — ABNORMAL LOW
Vitamin B-12: 505 pg/mL (ref 200–1100)

## 2023-07-23 ENCOUNTER — Ambulatory Visit: Admitting: Sleep Medicine

## 2023-07-29 ENCOUNTER — Ambulatory Visit (INDEPENDENT_AMBULATORY_CARE_PROVIDER_SITE_OTHER): Admitting: Sleep Medicine

## 2023-07-29 VITALS — BP 100/62 | HR 72 | Temp 98.2°F | Resp 18 | Ht 71.0 in | Wt 183.8 lb

## 2023-07-29 DIAGNOSIS — G4733 Obstructive sleep apnea (adult) (pediatric): Secondary | ICD-10-CM

## 2023-07-29 DIAGNOSIS — R0683 Snoring: Secondary | ICD-10-CM

## 2023-07-29 NOTE — Progress Notes (Signed)
 Name:Kavin Gaida MRN: [id number] DOB: 02/09/2000   CHIEF COMPLAINT:  EXCESSIVE DAYTIME SLEEPINESS   HISTORY OF PRESENT ILLNESS:  Mr. Faiola is a 23 y.o. w/ a h/o anxiety and depression who presents for c/o snoring and excessive daytime sleepiness which has been present for several years. Denies nocturnal awakenings. Gained 20 lbs over the last two years. Admits to morning headaches and night sweats. Denies a family history of sleep apnea. Reports occasional drowsy driving. Denies alcohol, tobacco or illicit drug use.   Bedtime 11 pm Sleep onset 10 mins Rise time 8-9 am   EPWORTH SLEEP SCORE 2    07/29/2023    9:00 AM  Results of the Epworth flowsheet  Sitting and reading 0  Watching TV 0  Sitting, inactive in a public place (e.g. a theatre or a meeting) 0  As a passenger in a car for an hour without a break 1  Lying down to rest in the afternoon when circumstances permit 1  Sitting and talking to someone 0  Sitting quietly after a lunch without alcohol 0  In a car, while stopped for a few minutes in traffic 0  Total score 2    PAST MEDICAL HISTORY :   has a past medical history of Allergy, Anxiety, and Depression.  has no past surgical history on file. Prior to Admission medications   Medication Sig Start Date End Date Taking? Authorizing Provider  sertraline (ZOLOFT) 100 MG tablet Take 150 mg by mouth daily. Patient not taking: Reported on 07/29/2023 02/21/21   [provider]   No Known Allergies  FAMILY HISTORY:  family history is not on file. SOCIAL HISTORY:  reports that he has never smoked. He has never used smokeless tobacco. He reports that he does not drink alcohol and does not use drugs.   Review of Systems:  Gen:  Denies  fever, sweats, chills weight loss  HEENT: Denies blurred vision, double vision, ear pain, eye pain, hearing loss, nose bleeds, sore throat Cardiac:  No dizziness, chest pain or heaviness, chest tightness,edema, No  JVD Resp:   No cough, -sputum production, -shortness of breath,-wheezing, -hemoptysis,  Gi: Denies swallowing difficulty, stomach pain, nausea or vomiting, diarrhea, constipation, bowel incontinence Gu:  Denies bladder incontinence, burning urine Ext:   Denies Joint pain, stiffness or swelling Skin: Denies  skin rash, easy bruising or bleeding or hives Endoc:  Denies polyuria, polydipsia , polyphagia or weight change Psych:   Denies depression, insomnia or hallucinations  Other:  All other systems negative  VITAL SIGNS: BP 100/62 (BP Location: Right Arm, Patient Position: Sitting)   Pulse 72   Temp 98.2 F (36.8 C) (Oral)   Resp 18   Ht 5' 11 (1.803 m)   Wt 183 lb 12.8 oz (83.4 kg)   SpO2 98%   BMI 25.63 kg/m    Physical Examination:   General Appearance: No distress  EYES PERRLA, EOM intact.   NECK Supple, No JVD Pulmonary: normal breath sounds, No wheezing.  CardiovascularNormal S1,S2.  No m/r/g.   Abdomen: Benign, Soft, non-tender. Skin:   warm, no rashes, no ecchymosis  Extremities: normal, no cyanosis, clubbing. Neuro:without focal findings,  speech normal  PSYCHIATRIC: Mood, affect within normal limits.   ASSESSMENT AND PLAN  OSA I suspect that OSA is likely present due to clinical presentation. Discussed the consequences of untreated sleep apnea. Advised not to drive drowsy for safety of patient and others. Will complete further evaluation with a home  sleep study and follow up to review results.     MEDICATION ADJUSTMENTS/LABS AND TESTS ORDERED: Recommend Sleep Study   Patient  satisfied with Plan of action and management. All questions answered  Follow up to review HST results and treatment plan.   I spent a total of 31 minutes reviewing chart data, face-to-face evaluation with the patient, counseling and coordination of care as detailed above.    Sheniah Supak, M.D.  Sleep Medicine [facility]

## 2023-07-29 NOTE — Patient Instructions (Signed)
 Ricky Ramsey

## 2023-08-26 ENCOUNTER — Encounter

## 2023-08-26 DIAGNOSIS — G4730 Sleep apnea, unspecified: Secondary | ICD-10-CM | POA: Diagnosis not present

## 2023-08-26 DIAGNOSIS — G4733 Obstructive sleep apnea (adult) (pediatric): Secondary | ICD-10-CM

## 2023-09-02 DIAGNOSIS — R069 Unspecified abnormalities of breathing: Secondary | ICD-10-CM | POA: Diagnosis not present

## 2023-09-07 ENCOUNTER — Ambulatory Visit: Payer: Self-pay

## 2023-09-09 ENCOUNTER — Ambulatory Visit (INDEPENDENT_AMBULATORY_CARE_PROVIDER_SITE_OTHER): Admitting: Sleep Medicine

## 2023-09-09 ENCOUNTER — Encounter: Payer: Self-pay | Admitting: Sleep Medicine

## 2023-09-09 VITALS — BP 106/60 | HR 78 | Temp 97.1°F | Ht 71.0 in | Wt 178.0 lb

## 2023-09-09 DIAGNOSIS — F419 Anxiety disorder, unspecified: Secondary | ICD-10-CM

## 2023-09-09 DIAGNOSIS — F411 Generalized anxiety disorder: Secondary | ICD-10-CM

## 2023-09-09 DIAGNOSIS — G4733 Obstructive sleep apnea (adult) (pediatric): Secondary | ICD-10-CM | POA: Diagnosis not present

## 2023-09-09 NOTE — Patient Instructions (Signed)

## 2023-09-09 NOTE — Progress Notes (Signed)
 Name:Ricky Ramsey MRN: [id number] DOB: 11/06/2000   CHIEF COMPLAINT:  HST F/U   HISTORY OF PRESENT ILLNESS: Mr. Ricky Ramsey is a 23 y.o. w/ a h/o anxiety and depression who presents to follow up on HST results. The patient underwent HST which revealed mild OSA (AHI 6.4, O2 nadir 88%).    EPWORTH SLEEP SCORE 2     07/29/2023    9:00 AM  Results of the Epworth flowsheet  Sitting and reading 0  Watching TV 0  Sitting, inactive in a public place (e.g. a theatre or a meeting) 0  As a passenger in a car for an hour without a break 1  Lying down to rest in the afternoon when circumstances permit 1  Sitting and talking to someone 0  Sitting quietly after a lunch without alcohol 0  In a car, while stopped for a few minutes in traffic 0  Total score 2    PAST MEDICAL HISTORY :   has a past medical history of Allergy, Anxiety, and Depression.  has no past surgical history on file. Prior to Admission medications   Medication Sig Start Date End Date Taking? Authorizing Provider  sertraline (ZOLOFT) 100 MG tablet Take 150 mg by mouth daily. Patient not taking: Reported on 09/09/2023 02/21/21   [provider]   No Known Allergies  FAMILY HISTORY:  family history is not on file. SOCIAL HISTORY:  reports that he has never smoked. He has never used smokeless tobacco. He reports that he does not drink alcohol and does not use drugs.   Review of Systems:  Gen:  Denies  fever, sweats, chills weight loss  HEENT: Denies blurred vision, double vision, ear pain, eye pain, hearing loss, nose bleeds, sore throat Cardiac:  No dizziness, chest pain or heaviness, chest tightness,edema, No JVD Resp:   No cough, -sputum production, -shortness of breath,-wheezing, -hemoptysis,  Gi: Denies swallowing difficulty, stomach pain, nausea or vomiting, diarrhea, constipation, bowel incontinence Gu:  Denies bladder incontinence, burning urine Ext:   Denies Joint pain, stiffness or  swelling Skin: Denies  skin rash, easy bruising or bleeding or hives Endoc:  Denies polyuria, polydipsia , polyphagia or weight change Psych:   Denies depression, insomnia or hallucinations  Other:  All other systems negative  VITAL SIGNS: BP 106/60   Pulse 78   Temp (!) 97.1 F (36.2 C)   Ht 5' 11 (1.803 m)   Wt 178 lb (80.7 kg)   SpO2 100%   BMI 24.83 kg/m    Physical Examination:   General Appearance: No distress  EYES PERRLA, EOM intact.   NECK Supple, No JVD Pulmonary: normal breath sounds, No wheezing.  CardiovascularNormal S1,S2.  No m/r/g.   Abdomen: Benign, Soft, non-tender. Skin:   warm, no rashes, no ecchymosis  Extremities: normal, no cyanosis, clubbing. Neuro:without focal findings,  speech normal  PSYCHIATRIC: Mood, affect within normal limits.   ASSESSMENT AND PLAN  OSA Reviewed HST results with patient. Starting on APAP therapy set to 4-12 cm H2O. Discussed the consequences of untreated sleep apnea. Advised not to drive drowsy for safety of patient and others. Will follow up in 3 months.     Anxiety Advised patient to follow up with PCP for further management.    Patient  satisfied with Plan of action and management. All questions answered  I spent a total of 20 minutes reviewing chart data, face-to-face evaluation with the patient, counseling and coordination of care as detailed above.  Marshall Roehrich, M.D.  Sleep Medicine [facility]

## 2023-09-29 ENCOUNTER — Telehealth: Payer: Self-pay | Admitting: Sleep Medicine

## 2023-09-29 NOTE — Telephone Encounter (Signed)
 We have received a note from [facility] the patient has declined Cpap Setup

## 2023-10-04 NOTE — Telephone Encounter (Signed)
 Noted. Nothing further needed.

## 2023-10-11 ENCOUNTER — Ambulatory Visit: Admitting: Dermatology

## 2023-10-18 DIAGNOSIS — F429 Obsessive-compulsive disorder, unspecified: Secondary | ICD-10-CM | POA: Diagnosis not present

## 2023-10-18 DIAGNOSIS — F70 Mild intellectual disabilities: Secondary | ICD-10-CM | POA: Diagnosis not present

## 2023-10-18 DIAGNOSIS — F411 Generalized anxiety disorder: Secondary | ICD-10-CM | POA: Diagnosis not present

## 2023-10-18 DIAGNOSIS — F900 Attention-deficit hyperactivity disorder, predominantly inattentive type: Secondary | ICD-10-CM | POA: Diagnosis not present

## 2023-11-29 ENCOUNTER — Ambulatory Visit

## 2023-11-29 DIAGNOSIS — L649 Androgenic alopecia, unspecified: Secondary | ICD-10-CM | POA: Diagnosis not present

## 2023-11-29 DIAGNOSIS — L219 Seborrheic dermatitis, unspecified: Secondary | ICD-10-CM | POA: Diagnosis not present

## 2023-11-29 MED ORDER — KETOCONAZOLE 2 % EX CREA: TOPICAL_CREAM | CUTANEOUS | 5 refills | Status: AC

## 2023-11-29 MED ORDER — CLOBETASOL PROPIONATE 0.05 % EX SOLN: CUTANEOUS | 5 refills | Status: AC

## 2023-11-29 MED ORDER — KETOCONAZOLE 2 % EX SHAM: MEDICATED_SHAMPOO | CUTANEOUS | 5 refills | Status: AC

## 2023-11-29 MED ORDER — TACROLIMUS 0.1 % EX OINT: TOPICAL_OINTMENT | CUTANEOUS | 5 refills | Status: AC

## 2023-11-29 NOTE — Patient Instructions (Addendum)
 Begin Minoxidil 5% foam (or solution) otherwise known as Rogaine - this is often called Mens Strength - but we use it exclusively in men and women. You are going to apply this once or twice daily in areas where you feel are thinner than the remainder.  It can take up to 6-8 months of applying this medication to notice an improvement.  You will need to use this long term to maintain the results of increased hair growth.       1. Alternate over the counter dandruff shampoos. Examples include T-gel, T-sal, selsun blue, head and shoulders - Line up 2-3 shampoos in the shower, use one, move it to the back of the line, then the next time you shower use the next shampoo - Lather shampoo onto the scalp and allow to soak for for 5-10 min prior to rinsing - Do this all of the time, even when your dandruff is under control  2. Use the topical steroid (Clobetasol) as needed when your scalp is itching and/or flaky/rashy  - stop once your scalp is under control - restart as needed     Seborrheic Dermatitis: Care Instructions Your Care Instructions Seborrheic dermatitis (say seh-buh-REE-ick der-muh-TY-tus) is a skin problem that causes a reddish rash with greasy, flaky, yellow skin patches. The rash may appear on many parts of the body. It may be on the scalp, face (especially the eyebrow area and between the nose and mouth), ears, breasts, underarms, and genital area. The flaky skin on the scalp is called dandruff. This rash is often a long-term (chronic) condition. It may last for years. But the symptoms may come and go. Symptoms can be treated with special creams, shampoos, or other skin care. The cause of seborrheic dermatitis is not fully understood. It may occur when skin glands make too much oil. It may get worse in cold weather or with stress. A type of skin fungus, or yeast, may also be linked with this condition. Follow-up care is a key part of your treatment and safety. Be sure to make and  go to all appointments, and call your doctor if you are having problems. It's also a good idea to know your test results and keep a list of the medicines you take. How can you care for yourself at home? If your doctor prescribes a steroid cream, dandruff shampoo, or antifungal cream or medicine, use it as directed. If your doctor prescribes other medicine, take it as directed. Use a dandruff shampoo if seborrheic dermatitis affects your scalp. This includes Head & Shoulders, Sebulex, and Selsun Blue. You may need to try a few kinds of shampoo to find the one that works best for you. To help with itching: Use hydrocortisone cream. Follow the directions on the label. Use cold, wet cloths. Take an over-the-counter antihistamine, such as diphenhydramine  (Benadryl ) or loratadine (Claritin). Read and follow all instructions on the label.  Where can you learn more? Go to [facility] at https://myuncchart.org Select Patient Education under [employer]. Enter [phone] in the search box to learn more about Seborrheic Dermatitis: Care Instructions. Current as of: July 14, 2018                 2006-2020 [employer].  Care instructions adapted under license by [facility]. If you have questions about a medical condition or this instruction, always ask your healthcare professional. [employer], Inc[employer] disclaims any warranty or liability for your use of this information.        Due  to recent changes in healthcare laws, you may see results of your pathology and/or laboratory studies on MyChart before the doctors have had a chance to review them. We understand that in some cases there may be results that are confusing or concerning to you. Please understand that not all results are received at the same time and often the doctors may need to interpret multiple results in order to provide you with the best plan of care or course of treatment. Therefore, we ask that you please give us  2 business days  to thoroughly review all your results before contacting the office for clarification. Should we see a critical lab result, you will be contacted sooner.   If You Need Anything After Your Visit  If you have any questions or concerns for your doctor, please call our main line at [phone] and press option 4 to reach your doctor's medical assistant. If no one answers, please leave a voicemail as directed and we will return your call as soon as possible. Messages left after 4 pm will be answered the following business day.   You may also send us  a message via MyChart. We typically respond to MyChart messages within 1-2 business days.  For prescription refills, please ask your pharmacy to contact our office. Our fax number is [phone].  If you have an urgent issue when the clinic is closed that cannot wait until the next business day, you can page your doctor at the number below.    Please note that while we do our best to be available for urgent issues outside of office hours, we are not available 24/7.   If you have an urgent issue and are unable to reach us , you may choose to seek medical care at your doctor's office, retail clinic, urgent care center, or emergency room.  If you have a medical emergency, please immediately call 911 or go to the emergency department.  Pager Numbers  - Dr. Hester: [phone]  - Dr. Jackquline: [phone]  - Dr. Claudene: [phone]   - Dr. Raymund: [phone]  In the event of inclement weather, please call our main line at [phone] for an update on the status of any delays or closures.  Dermatology Medication Tips: Please keep the boxes that topical medications come in in order to help keep track of the instructions about where and how to use these. Pharmacies typically print the medication instructions only on the boxes and not directly on the medication tubes.   If your medication is too expensive, please contact our office at  [phone] option 4 or send us  a message through MyChart.   We are unable to tell what your co-pay for medications will be in advance as this is different depending on your insurance coverage. However, we may be able to find a substitute medication at lower cost or fill out paperwork to get insurance to cover a needed medication.   If a prior authorization is required to get your medication covered by your insurance company, please allow us  1-2 business days to complete this process.  Drug prices often vary depending on where the prescription is filled and some pharmacies may offer cheaper prices.  The website www.goodrx.com contains coupons for medications through different pharmacies. The prices here do not account for what the cost may be with help from insurance (it may be cheaper with your insurance), but the website can give you the price if you did not use any insurance.  - You can print the  associated coupon and take it with your prescription to the pharmacy.  - You may also stop by our office during regular business hours and pick up a GoodRx coupon card.  - If you need your prescription sent electronically to a different pharmacy, notify our office through [facility] or by phone at [phone] option 4.     Si Usted Necesita Algo Despus de Su Visita  Tambin puede enviarnos un mensaje a travs de Clinical Cytogeneticist. Por lo general respondemos a los mensajes de MyChart en el transcurso de 1 a 2 das hbiles.  Para renovar recetas, por favor pida a su farmacia que se ponga en contacto con nuestra oficina. Randi lakes de fax es [place] [phone].  Si tiene un asunto urgente cuando la clnica est cerrada y que no puede esperar hasta el siguiente da hbil, puede llamar/localizar a su doctor(a) al nmero que aparece a continuacin.   Por favor, tenga en cuenta que aunque hacemos todo lo posible para estar disponibles para asuntos urgentes fuera del horario de [place], no estamos  disponibles las 24 horas del da, los 7 [address]  [address] de la [place].   Si tiene un problema urgente y no puede comunicarse con nosotros, puede optar por buscar atencin mdica  en el consultorio de su doctor(a), en una clnica privada, en un centro de atencin urgente o en una sala de emergencias.  Si tiene engineer, drilling, por favor llame inmediatamente al 911 o vaya a la sala de emergencias.  Nmeros de bper  - Dr. Hester: [phone]  - Dra. Jackquline: [phone]  - Dr. Claudene: [phone]  - Dra. Kitts: [phone]  En caso de inclemencias del [place], por favor llame a nuestra lnea principal al [phone] para una actualizacin sobre el estado de cualquier retraso o cierre.  Consejos para la medicacin en dermatologa: Por favor, guarde las cajas en las que vienen los medicamentos de uso tpico para ayudarle a seguir las instrucciones sobre dnde y cmo usarlos. Las farmacias generalmente imprimen las instrucciones del medicamento slo en las cajas y no directamente en los tubos del [place].   Si su medicamento es muy caro, por favor, pngase en contacto con landry rieger llamando al [phone] y presione la opcin 4 o envenos un mensaje a travs de Clinical Cytogeneticist.   No podemos decirle cul ser su copago por los medicamentos por adelantado ya que esto es diferente dependiendo de la cobertura de su seguro. Sin embargo, es posible que podamos encontrar un medicamento sustituto a audiological scientist un formulario para que el seguro cubra el medicamento que se considera necesario.   Si se requiere una autorizacin previa para que su compaa de seguros cubra su medicamento, por favor permtanos de 1 a 2 das hbiles para completar este proceso.  Los precios de los medicamentos varan con frecuencia dependiendo del environmental consultant de dnde se surte la receta y alguna farmacias pueden ofrecer precios ms baratos.  El sitio web www.goodrx.com tiene cupones para medicamentos de engineer, civil (consulting). Los precios aqu no tienen en cuenta lo que podra costar con la ayuda del seguro (puede ser ms barato con su seguro), pero el sitio web puede darle el precio si no utiliz tourist information centre manager.  - Puede imprimir el cupn correspondiente y llevarlo con su receta a la farmacia.  - Tambin puede pasar por nuestra oficina durante el horario de atencin regular y education officer, museum una tarjeta de cupones de GoodRx.  - Si necesita que su receta se enve electrnicamente a [place],  informe a nuestra oficina a travs de MyChart de [place] o por telfono llamando al [phone] y presione la opcin 4.

## 2023-11-29 NOTE — Progress Notes (Signed)
 Subjective   Ricky Ramsey is a 23 y.o. male who presents for the following: Rash. Patient is new patient  Today patient reports: Rash on the face; intermittently for several years. Patient states the rash became more frequent and spread over more of his face over the past couple of months. Patient has used OTC eczema creams and dandruff shampoo that have improved but never fully resolved the rash.   Hair loss w/a + family hx of balding.   Review of Systems:    No other skin or systemic complaints except as noted in HPI or Assessment and Plan.  The following portions of the chart were reviewed this encounter and updated as appropriate: medications, allergies, medical history  Relevant Medical History:  n/a   Objective  (SKPE) Well appearing patient in no apparent distress; mood and affect are within normal limits. Examination was performed of the: Focused Exam of: Face and scalp   Examination notable for: Seborrheic Dermatitis: Erythema and scaling of the scalp, ear canals, and central face > rest of face.  Frontotemporal hair thinning  Examination limited by: Clothing and Patient deferred removal       Assessment & Plan  (SKAP)   Seborrheic dermatitis of the scalp and face Chronic and persistent condition with duration or expected duration over one year. Condition is symptomatic and bothersome to patient. Patient is flaring and not currently at treatment goal.  - Discussed diagnosis, typical course, and treatment options for this condition - Explained to the patient the chronic nature of this diagnosis - Start ketoconazole 2% shampoo TIW, apply to scalp and affected areas of face/body and allow the shampoo to sit for 5-10 minutes before rinsing off - provided keto cream for face  - Consider alternating with use of Head and Shoulders or Selsun Blue anti-dandruff shampoo which contains zinc pyrithione 1% - Start tacrolimus ointment 0.1% twice daily on face  Educated about  black box warning (and also that recent studies show no increased malignancy risk) and common adverse effects such as burning with application (advised to cool in fridge and only apply to bone-dry skin). Given location of eruption, unsafe to use daily topical CS to area. Patient requires topical calcineurin inhibitor given high risk of dyspigmentation and atrophy from topical CS use in sensitive area. - Start clobetasol solution 0.05% twice daily to affected skin - Discussed side effect of super potent topical steroids including atrophy, dyspigmentation, striae, telangectasia, folliculitis, loss of skin pigment, hair growth, tachyphylaxis, risk of systemic absorption with missuse.  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 Condition is symptomatic and bothersome to patient. Patient is flaring and not currently at treatment goal.  - Discussed the nature of this condition that will progress over time Minoxidil 5% twice daily. Educated about proper use, including ensuring the medication is applied directly to the scalp. Discusssed that minoxidil can initially cause some hair shedding. Reviewed that expectation and goal of treatment is to prevent further loss, rather than hair regrowth.     Level of service outlined above   Patient instructions (SKPI)   Procedures, orders, diagnosis for this visit:    There are no diagnoses linked to this encounter.  Return to clinic: No follow-ups on file.  I, Emerick Ege, CMA am acting as scribe for Lauraine JAYSON Kanaris, MD.   Documentation: I have reviewed the above documentation for accuracy and completeness, and I agree with the above.  Lauraine JAYSON Kanaris,  MD

## 2024-01-17 ENCOUNTER — Ambulatory Visit: Admitting: Nurse Practitioner

## 2024-01-17 ENCOUNTER — Other Ambulatory Visit (HOSPITAL_COMMUNITY)
Admission: RE | Admit: 2024-01-17 | Discharge: 2024-01-17 | Disposition: A | Discharge status: Home / Self Care | Source: Ambulatory Visit | Attending: Nurse Practitioner | Admitting: Nurse Practitioner

## 2024-01-17 ENCOUNTER — Encounter: Payer: Self-pay | Admitting: Nurse Practitioner

## 2024-01-17 VITALS — BP 100/72 | HR 93 | Temp 98.0°F | Ht 71.0 in | Wt 168.0 lb

## 2024-01-17 DIAGNOSIS — R200 Anesthesia of skin: Secondary | ICD-10-CM | POA: Diagnosis not present

## 2024-01-17 DIAGNOSIS — Z13220 Encounter for screening for lipoid disorders: Secondary | ICD-10-CM

## 2024-01-17 DIAGNOSIS — R350 Frequency of micturition: Secondary | ICD-10-CM | POA: Diagnosis not present

## 2024-01-17 DIAGNOSIS — Z113 Encounter for screening for infections with a predominantly sexual mode of transmission: Secondary | ICD-10-CM | POA: Diagnosis not present

## 2024-01-17 DIAGNOSIS — R202 Paresthesia of skin: Secondary | ICD-10-CM

## 2024-01-17 LAB — POCT URINALYSIS DIPSTICK
Bilirubin, UA: NEGATIVE
Blood, UA: NEGATIVE
Glucose, UA: NEGATIVE
Ketones, UA: NEGATIVE
Leukocytes, UA: NEGATIVE
Nitrite, UA: NEGATIVE
Protein, UA: NEGATIVE
Spec Grav, UA: 1.015
Urobilinogen, UA: 0.2 U/dL
pH, UA: 7

## 2024-01-17 NOTE — Progress Notes (Signed)
 "  BP 100/72   Pulse 93   Temp 98 F (36.7 C)   Ht 5' 11 (1.803 m)   Wt 168 lb (76.2 kg)   SpO2 97%   BMI 23.43 kg/m    Subjective:    Patient ID: Ricky Ramsey, male    DOB: Apr 04, 2000, 24 y.o.   MRN: [id number]  HPI: Ricky Ramsey is a 24 y.o. male  Chief Complaint  Patient presents with   std testing   Discussed the use of AI scribe software for clinical note transcription with the patient, who gave verbal consent to proceed.  History of Present Illness Ricky Ramsey is a 24 year old male who presents with cold-induced numbness and tingling in his fingers and abdominal discomfort.  Cold-induced digital paresthesia - Numbness and tingling in fingers during cold weather - Described as loss of sensation without discoloration - Sensation takes a while to return after exposure to cold - Long-standing issue, previously thought to be normal  Abdominal paresthesia and discomfort - Localized numbness and tingling in the abdomen, occurring on either side but not simultaneously - Present for approximately one to two months - Occasionally accompanied by tingling pain - First episode occurred after taking iron supplement on an empty stomach, unclear if consistently related - No changes in bowel habits - No acid reflux or back pain - Consumes a lot of spicy foods, suspects possible correlation with abdominal discomfort  Urinary frequency - Increased urinary frequency since New Year's - No associated pain with urination  Dietary changes and family history - Increased sugar intake since October due to holiday treats at work - Family history of diabetes: mother is prediabetic, father is close to being diabetic  Supplement use - Takes a multivitamin daily - Recently started iron supplement, unsure if related to symptoms         01/17/2024   10:20 AM 07/14/2023    8:35 AM 03/25/2021    1:37 PM  Depression screen PHQ 2/9  Decreased Interest 0 0 0  Down, Depressed,  Hopeless 0 0 0  PHQ - 2 Score 0 0 0  Altered sleeping 0 0 0  Tired, decreased energy 1 0 0  Change in appetite 0 0 0  Feeling bad or failure about yourself  0 0 0  Trouble concentrating 0 0 0  Moving slowly or fidgety/restless 1 0 0  Suicidal thoughts 0 0 0  PHQ-9 Score 2 0  0   Difficult doing work/chores  Not difficult at all Not difficult at all     Data saved with a previous flowsheet row definition       01/17/2024   10:20 AM 07/14/2023    8:35 AM 03/25/2021    1:38 PM  GAD 7 : Generalized Anxiety Score  Nervous, Anxious, on Edge 1 0 1  Control/stop worrying 0 0 0  Worry too much - different things 2 0 0  Trouble relaxing 0 0 0  Restless 0 0 0  Easily annoyed or irritable 0 0 0  Afraid - awful might happen 2 0 0  Total GAD 7 Score 5 0 1  Anxiety Difficulty  Not difficult at all Not difficult at all     Relevant past medical, surgical, family and social history reviewed and updated as indicated. Interim medical history since our last visit reviewed. Allergies and medications reviewed and updated.  Review of Systems  Ten systems reviewed and is negative except as mentioned in HPI  Objective:      BP 100/72   Pulse 93   Temp 98 F (36.7 C)   Ht 5' 11 (1.803 m)   Wt 168 lb (76.2 kg)   SpO2 97%   BMI 23.43 kg/m     Wt Readings from Last 3 Encounters:  01/17/24 168 lb (76.2 kg)  09/09/23 178 lb (80.7 kg)  07/29/23 183 lb 12.8 oz (83.4 kg)    Physical Exam GENERAL: Alert, cooperative, well developed, no acute distress. HEENT: Normocephalic, normal oropharynx, moist mucous membranes. CHEST: Clear to auscultation bilaterally, no wheezes, rhonchi, or crackles. CARDIOVASCULAR: Normal heart rate and rhythm, S1 and S2 normal without murmurs. ABDOMEN: Soft, non-tender, non-distended, without organomegaly, normal bowel sounds, no pain on palpation. EXTREMITIES: No cyanosis or edema. NEUROLOGICAL: Cranial nerves grossly intact, moves all extremities without gross  motor or sensory deficit.  Results for orders placed or performed in visit on 01/17/24  POCT urinalysis dipstick   Collection Time: 01/17/24 10:30 AM  Result Value Ref Range   Color, UA yellow    Clarity, UA clear    Glucose, UA Negative Negative   Bilirubin, UA negative    Ketones, UA negative    Spec Grav, UA 1.015 1.010 - 1.025   Blood, UA negative    pH, UA 7.0 5.0 - 8.0   Protein, UA Negative Negative   Urobilinogen, UA 0.2 0.2 or 1.0 E.U./dL   Nitrite, UA negative    Leukocytes, UA Negative Negative   Appearance     Odor             Assessment & Plan:   Problem List Items Addressed This Visit   None Visit Diagnoses       Screening examination for STD (sexually transmitted disease)    -  Primary   Relevant Orders   HIV Antibody (routine testing w rflx)   RPR W/RFLX TO RPR TITER, TREPONEMAL AB, SCREEN AND DIAGNOSIS   Hepatitis C antibody   Urine cytology ancillary only     Numbness and tingling in both hands       Relevant Orders   CBC with Differential/Platelet   Comprehensive metabolic panel with GFR   TSH   VITAMIN D  25 Hydroxy (Vit-D Deficiency, Fractures)   Iron, TIBC and Ferritin Panel   B12 and Folate Panel   Vitamin B1   Magnesium     Screening for cholesterol level       Relevant Orders   Lipid panel     Urinary frequency       Relevant Orders   Comprehensive metabolic panel with GFR   POCT urinalysis dipstick (Completed)   Hemoglobin A1c        Assessment and Plan Assessment & Plan Raynaud's phenomenon Intermittent cold and numb sensation in fingers, likely Raynaud's phenomenon, exacerbated by cold weather. Differential includes vitamin deficiencies. - Advised use of hot hands and gloves to keep fingers warm. - Ordered blood tests to check vitamin levels.  Abdominal paresthesia Intermittent numb or dull sensation in the abdomen, described as tingling pain. No clear correlation with dietary intake. No significant findings on physical  exam. - Ordered blood tests to check liver enzymes and kidney function. - Advised to reduce spicy food intake to assess impact on symptoms.  Urinary frequency Increased urinary frequency over the past two weeks without pain or fever. Possible dehydration noted. Differential includes diabetes mellitus. - Ordered urinalysis to check for infection. - Ordered A1c test to assess for diabetes mellitus.  General health maintenance Discussion of dietary habits and vitamin supplementation. Ferritin level previously low, currently taking iron supplements. - Ordered blood tests to recheck ferritin and other vitamin levels.  STD screening Request for STD testing. - Ordered STD screening tests.        Follow up plan: Return if symptoms worsen or fail to improve. "

## 2024-01-18 ENCOUNTER — Ambulatory Visit: Payer: Self-pay | Admitting: Nurse Practitioner

## 2024-01-18 LAB — URINE CYTOLOGY ANCILLARY ONLY
Chlamydia: NEGATIVE
Comment: NEGATIVE
Comment: NEGATIVE
Comment: NORMAL
Neisseria Gonorrhea: NEGATIVE
Trichomonas: NEGATIVE

## 2024-01-21 LAB — COMPREHENSIVE METABOLIC PANEL WITH GFR
AG Ratio: 1.9 (calc) (ref 1.0–2.5)
ALT: 8 U/L — ABNORMAL LOW (ref 9–46)
AST: 13 U/L (ref 10–40)
Albumin: 5 g/dL (ref 3.6–5.1)
Alkaline phosphatase (APISO): 66 U/L (ref 36–130)
BUN: 19 mg/dL (ref 7–25)
CO2: 28 mmol/L (ref 20–32)
Calcium: 10.2 mg/dL (ref 8.6–10.3)
Chloride: 103 mmol/L (ref 98–110)
Creat: 0.84 mg/dL (ref 0.60–1.24)
Globulin: 2.6 g/dL (ref 1.9–3.7)
Glucose, Bld: 80 mg/dL (ref 65–99)
Potassium: 4.2 mmol/L (ref 3.5–5.3)
Sodium: 139 mmol/L (ref 135–146)
Total Bilirubin: 1.4 mg/dL — ABNORMAL HIGH (ref 0.2–1.2)
Total Protein: 7.6 g/dL (ref 6.1–8.1)
eGFR: 126 mL/min/1.73m2

## 2024-01-21 LAB — MAGNESIUM: Magnesium: 2 mg/dL (ref 1.5–2.5)

## 2024-01-21 LAB — CBC WITH DIFFERENTIAL/PLATELET
Absolute Lymphocytes: 2147 {cells}/uL (ref 850–3900)
Absolute Monocytes: 656 {cells}/uL (ref 200–950)
Basophils Absolute: 49 {cells}/uL (ref 0–200)
Basophils Relative: 0.6 %
Eosinophils Absolute: 170 {cells}/uL (ref 15–500)
Eosinophils Relative: 2.1 %
HCT: 48 % (ref 39.4–51.1)
Hemoglobin: 15.8 g/dL (ref 13.2–17.1)
MCH: 28 pg (ref 27.0–33.0)
MCHC: 32.9 g/dL (ref 31.6–35.4)
MCV: 85.1 fL (ref 81.4–101.7)
MPV: 9.9 fL (ref 7.5–12.5)
Monocytes Relative: 8.1 %
Neutro Abs: 5079 {cells}/uL (ref 1500–7800)
Neutrophils Relative %: 62.7 %
Platelets: 241 Thousand/uL (ref 140–400)
RBC: 5.64 Million/uL (ref 4.20–5.80)
RDW: 13 % (ref 11.0–15.0)
Total Lymphocyte: 26.5 %
WBC: 8.1 Thousand/uL (ref 3.8–10.8)

## 2024-01-21 LAB — LIPID PANEL
Cholesterol: 189 mg/dL
HDL: 49 mg/dL
LDL Cholesterol (Calc): 123 mg/dL — ABNORMAL HIGH
Non-HDL Cholesterol (Calc): 140 mg/dL — ABNORMAL HIGH
Total CHOL/HDL Ratio: 3.9 (calc)
Triglycerides: 71 mg/dL

## 2024-01-21 LAB — B12 AND FOLATE PANEL
Folate: 11.6 ng/mL
Vitamin B-12: 658 pg/mL (ref 200–1100)

## 2024-01-21 LAB — IRON,TIBC AND FERRITIN PANEL
%SAT: 43 % (ref 20–48)
Ferritin: 78 ng/mL (ref 38–380)
Iron: 169 ug/dL (ref 50–195)
TIBC: 395 ug/dL (ref 250–425)

## 2024-01-21 LAB — HEMOGLOBIN A1C
Hgb A1c MFr Bld: 5.4 %
Mean Plasma Glucose: 108 mg/dL
eAG (mmol/L): 6 mmol/L

## 2024-01-21 LAB — VITAMIN B1: Vitamin B1 (Thiamine): 16 nmol/L (ref 8–30)

## 2024-01-21 LAB — HIV ANTIBODY (ROUTINE TESTING W REFLEX)
HIV 1&2 Ab, 4th Generation: NONREACTIVE
HIV FINAL INTERPRETATION: NEGATIVE

## 2024-01-21 LAB — VITAMIN D 25 HYDROXY (VIT D DEFICIENCY, FRACTURES): Vit D, 25-Hydroxy: 25 ng/mL — ABNORMAL LOW (ref 30–100)

## 2024-01-21 LAB — HEPATITIS C ANTIBODY: Hepatitis C Ab: NONREACTIVE

## 2024-01-21 LAB — SYPHILIS: RPR W/REFLEX TO RPR TITER AND TREPONEMAL ANTIBODIES, TRADITIONAL SCREENING AND DIAGNOSIS ALGORITHM: RPR Ser Ql: NONREACTIVE

## 2024-01-21 LAB — TSH: TSH: 1.28 m[IU]/L (ref 0.40–4.50)

## 2024-01-24 ENCOUNTER — Ambulatory Visit: Admitting: Nurse Practitioner

## 2024-03-10 ENCOUNTER — Encounter

## 2024-05-29 ENCOUNTER — Ambulatory Visit
